# Patient Record
Sex: Male | Born: 1950
Health system: Southern US, Community
[De-identification: ages and names within clinical notes are randomized; demographics above are authoritative.]

## PROBLEM LIST (undated history)

## (undated) DIAGNOSIS — H9190 Unspecified hearing loss, unspecified ear: Secondary | ICD-10-CM

## (undated) DIAGNOSIS — E7800 Pure hypercholesterolemia, unspecified: Secondary | ICD-10-CM

## (undated) DIAGNOSIS — M549 Dorsalgia, unspecified: Secondary | ICD-10-CM

## (undated) DIAGNOSIS — J309 Allergic rhinitis, unspecified: Secondary | ICD-10-CM

## (undated) HISTORY — DX: Pure hypercholesterolemia, unspecified: E78.00

## (undated) HISTORY — DX: Unspecified hearing loss, unspecified ear: H91.90

## (undated) HISTORY — DX: Allergic rhinitis, unspecified: J30.9

## (undated) HISTORY — PX: VASECTOMY: SHX75

## (undated) HISTORY — DX: Dorsalgia, unspecified: M54.9

---

## 2016-08-12 DIAGNOSIS — Z01818 Encounter for other preprocedural examination: Secondary | ICD-10-CM | POA: Diagnosis not present

## 2016-08-12 DIAGNOSIS — Z1211 Encounter for screening for malignant neoplasm of colon: Secondary | ICD-10-CM | POA: Diagnosis not present

## 2016-09-15 DIAGNOSIS — E7800 Pure hypercholesterolemia, unspecified: Secondary | ICD-10-CM | POA: Diagnosis not present

## 2016-09-15 DIAGNOSIS — Z79899 Other long term (current) drug therapy: Secondary | ICD-10-CM | POA: Diagnosis not present

## 2016-09-15 DIAGNOSIS — K5730 Diverticulosis of large intestine without perforation or abscess without bleeding: Secondary | ICD-10-CM | POA: Diagnosis not present

## 2016-09-15 DIAGNOSIS — Z1211 Encounter for screening for malignant neoplasm of colon: Secondary | ICD-10-CM | POA: Diagnosis not present

## 2016-09-29 DIAGNOSIS — Z125 Encounter for screening for malignant neoplasm of prostate: Secondary | ICD-10-CM | POA: Diagnosis not present

## 2016-09-29 DIAGNOSIS — Z23 Encounter for immunization: Secondary | ICD-10-CM | POA: Diagnosis not present

## 2016-09-29 DIAGNOSIS — Z79899 Other long term (current) drug therapy: Secondary | ICD-10-CM | POA: Diagnosis not present

## 2016-09-29 DIAGNOSIS — Z0000 Encounter for general adult medical examination without abnormal findings: Secondary | ICD-10-CM | POA: Diagnosis not present

## 2016-09-29 DIAGNOSIS — E785 Hyperlipidemia, unspecified: Secondary | ICD-10-CM | POA: Diagnosis not present

## 2016-09-29 DIAGNOSIS — Z9181 History of falling: Secondary | ICD-10-CM | POA: Diagnosis not present

## 2016-09-29 DIAGNOSIS — Z1389 Encounter for screening for other disorder: Secondary | ICD-10-CM | POA: Diagnosis not present

## 2017-05-06 DIAGNOSIS — H25813 Combined forms of age-related cataract, bilateral: Secondary | ICD-10-CM | POA: Diagnosis not present

## 2017-06-09 DIAGNOSIS — K219 Gastro-esophageal reflux disease without esophagitis: Secondary | ICD-10-CM | POA: Diagnosis not present

## 2017-06-09 DIAGNOSIS — R490 Dysphonia: Secondary | ICD-10-CM | POA: Diagnosis not present

## 2017-10-07 DIAGNOSIS — Z1339 Encounter for screening examination for other mental health and behavioral disorders: Secondary | ICD-10-CM | POA: Diagnosis not present

## 2017-10-07 DIAGNOSIS — Z6824 Body mass index (BMI) 24.0-24.9, adult: Secondary | ICD-10-CM | POA: Diagnosis not present

## 2017-10-07 DIAGNOSIS — E785 Hyperlipidemia, unspecified: Secondary | ICD-10-CM | POA: Diagnosis not present

## 2017-10-07 DIAGNOSIS — Z1331 Encounter for screening for depression: Secondary | ICD-10-CM | POA: Diagnosis not present

## 2017-10-07 DIAGNOSIS — R490 Dysphonia: Secondary | ICD-10-CM | POA: Diagnosis not present

## 2017-10-07 DIAGNOSIS — Z79899 Other long term (current) drug therapy: Secondary | ICD-10-CM | POA: Diagnosis not present

## 2017-10-07 DIAGNOSIS — Z9181 History of falling: Secondary | ICD-10-CM | POA: Diagnosis not present

## 2017-10-19 DIAGNOSIS — R490 Dysphonia: Secondary | ICD-10-CM | POA: Diagnosis not present

## 2017-10-19 DIAGNOSIS — Z6824 Body mass index (BMI) 24.0-24.9, adult: Secondary | ICD-10-CM | POA: Diagnosis not present

## 2017-11-16 DIAGNOSIS — R490 Dysphonia: Secondary | ICD-10-CM | POA: Diagnosis not present

## 2017-11-16 DIAGNOSIS — K219 Gastro-esophageal reflux disease without esophagitis: Secondary | ICD-10-CM | POA: Diagnosis not present

## 2017-11-16 DIAGNOSIS — R0982 Postnasal drip: Secondary | ICD-10-CM | POA: Diagnosis not present

## 2017-12-21 DIAGNOSIS — H25813 Combined forms of age-related cataract, bilateral: Secondary | ICD-10-CM | POA: Diagnosis not present

## 2017-12-30 DIAGNOSIS — H353131 Nonexudative age-related macular degeneration, bilateral, early dry stage: Secondary | ICD-10-CM | POA: Diagnosis not present

## 2018-02-18 DIAGNOSIS — H2511 Age-related nuclear cataract, right eye: Secondary | ICD-10-CM | POA: Diagnosis not present

## 2018-02-18 DIAGNOSIS — H40013 Open angle with borderline findings, low risk, bilateral: Secondary | ICD-10-CM | POA: Diagnosis not present

## 2018-02-18 DIAGNOSIS — Z01818 Encounter for other preprocedural examination: Secondary | ICD-10-CM | POA: Diagnosis not present

## 2018-02-23 DIAGNOSIS — J0100 Acute maxillary sinusitis, unspecified: Secondary | ICD-10-CM | POA: Diagnosis not present

## 2018-03-01 DIAGNOSIS — H2511 Age-related nuclear cataract, right eye: Secondary | ICD-10-CM | POA: Diagnosis not present

## 2018-03-01 DIAGNOSIS — H353131 Nonexudative age-related macular degeneration, bilateral, early dry stage: Secondary | ICD-10-CM | POA: Diagnosis not present

## 2018-03-01 DIAGNOSIS — H259 Unspecified age-related cataract: Secondary | ICD-10-CM | POA: Diagnosis not present

## 2018-03-01 DIAGNOSIS — H40013 Open angle with borderline findings, low risk, bilateral: Secondary | ICD-10-CM | POA: Diagnosis not present

## 2018-03-01 DIAGNOSIS — H52223 Regular astigmatism, bilateral: Secondary | ICD-10-CM | POA: Diagnosis not present

## 2018-03-01 DIAGNOSIS — H25811 Combined forms of age-related cataract, right eye: Secondary | ICD-10-CM | POA: Diagnosis not present

## 2018-04-19 DIAGNOSIS — H259 Unspecified age-related cataract: Secondary | ICD-10-CM | POA: Diagnosis not present

## 2018-04-19 DIAGNOSIS — H2512 Age-related nuclear cataract, left eye: Secondary | ICD-10-CM | POA: Diagnosis not present

## 2018-04-19 DIAGNOSIS — H25812 Combined forms of age-related cataract, left eye: Secondary | ICD-10-CM | POA: Diagnosis not present

## 2018-04-20 DIAGNOSIS — Z961 Presence of intraocular lens: Secondary | ICD-10-CM | POA: Diagnosis not present

## 2018-05-24 DIAGNOSIS — Z961 Presence of intraocular lens: Secondary | ICD-10-CM | POA: Diagnosis not present

## 2018-08-09 DIAGNOSIS — S99911A Unspecified injury of right ankle, initial encounter: Secondary | ICD-10-CM | POA: Diagnosis not present

## 2018-09-06 DIAGNOSIS — E785 Hyperlipidemia, unspecified: Secondary | ICD-10-CM | POA: Diagnosis not present

## 2018-09-06 DIAGNOSIS — Z125 Encounter for screening for malignant neoplasm of prostate: Secondary | ICD-10-CM | POA: Diagnosis not present

## 2018-09-06 DIAGNOSIS — Z79899 Other long term (current) drug therapy: Secondary | ICD-10-CM | POA: Diagnosis not present

## 2018-09-09 DIAGNOSIS — G20 Parkinson's disease: Secondary | ICD-10-CM | POA: Diagnosis not present

## 2018-09-09 DIAGNOSIS — E785 Hyperlipidemia, unspecified: Secondary | ICD-10-CM | POA: Diagnosis not present

## 2018-09-09 DIAGNOSIS — D649 Anemia, unspecified: Secondary | ICD-10-CM | POA: Diagnosis not present

## 2018-09-09 DIAGNOSIS — Z0000 Encounter for general adult medical examination without abnormal findings: Secondary | ICD-10-CM | POA: Diagnosis not present

## 2018-09-09 DIAGNOSIS — Z6823 Body mass index (BMI) 23.0-23.9, adult: Secondary | ICD-10-CM | POA: Diagnosis not present

## 2018-09-09 DIAGNOSIS — G4769 Other sleep related movement disorders: Secondary | ICD-10-CM | POA: Diagnosis not present

## 2018-09-21 ENCOUNTER — Ambulatory Visit (INDEPENDENT_AMBULATORY_CARE_PROVIDER_SITE_OTHER): Payer: PPO | Admitting: Neurology

## 2018-09-21 ENCOUNTER — Encounter: Payer: Self-pay | Admitting: Neurology

## 2018-09-21 VITALS — BP 128/90 | HR 67 | Ht 69.0 in | Wt 159.0 lb

## 2018-09-21 DIAGNOSIS — F515 Nightmare disorder: Secondary | ICD-10-CM | POA: Diagnosis not present

## 2018-09-21 DIAGNOSIS — G20 Parkinson's disease: Secondary | ICD-10-CM | POA: Diagnosis not present

## 2018-09-21 DIAGNOSIS — G4752 REM sleep behavior disorder: Secondary | ICD-10-CM

## 2018-09-21 NOTE — Progress Notes (Signed)
Subjective:    Patient ID: David Krueger is a 67 y.o. male.  HPI     David Foley, MD, PhD David Krueger [address], [address] [address] [place], [place] [address]  Dear Dr. Leonor Krueger,  I saw your patient, David Krueger, upon your kind request in my neurologic clinic today for initial consultation of his parkinsonism. The patient is accompanied by his wife today. As you know, David Krueger is a 67 year old right-handed gentleman with an underlying medical history of allergic rhinitis, hyperlipidemia, and anemia, who has had some recent changes in his gait and posture. He has had an intermittent tremor, he does not notice his motor changes or posture changes himself but his kids have noticed it and wife has noticed it. He is more concerned about his sleep disturbance. His wife reports that for the past 3+ years he has had intermittent vivid dreams and erratic behaviors and dreams including movements, twitching, and even more dramatic movements such as grabbing her. She sometimes gets scared because of the unpredictable behaviors in his sleep. They still sleep in the same bed together. They have been married 43 years. She has noticed increase in snoring and sometimes apneic pauses while he is asleep. He sometimes reports vivid dreams to her including fighting with an animal but typically does not have recollection of his dreams. He has a history of Parkinson's disease in paternal grandmother. His own mother lived to be 56 and had trembling or parkinsonism in her 8s. Of note, there son who is currently 19 was diagnosed with essential tremor when he was a child, maybe as young as 42 years old, no significant progression in the tremor and son has not been on any symptomatic treatment for this. Cognitively, patient is fine. He retired last year, he was a Clinical research associate. He is very active physically and socially. He likes to golf about 3 days out of the week. They have 3 children, son is the  oldest, they have 1 daughter in [place] and one in [place], [place]; she has 2 kids, ages 82 and 3. I reviewed your office note from 09/09/2018, which you kindly included. His Epworth sleepiness score is 8 out of 24 today, fatigue score is 21 out of 63. He is particularly worried about his sleep disturbance. He is married and lives with his wife, he is a nonsmoker, retired, does not utilize alcohol or caffeine on a regular basis.   His Past Medical History Is Significant For: Past Medical History:  Diagnosis Date  . Allergic rhinitis   . Hypercholesterolemia      His Past Surgical History Is Significant For:  His Family History Is Significant For: No family history on file.  His Social History Is Significant For: Social History   Socioeconomic History  . Marital status: Married    Spouse name: Not on file  . Number of children: Not on file  . Years of education: Not on file  . Highest education level: Not on file  Occupational History  . Not on file  Social Needs  . Financial resource strain: Not on file  . Food insecurity:    Worry: Not on file    Inability: Not on file  . Transportation needs:    Medical: Not on file    Non-medical: Not on file  Tobacco Use  . Smoking status: Never Smoker  . Smokeless tobacco: Never Used  Substance and Sexual Activity  . Alcohol use: Never    Frequency: Never  .  Drug use: Not on file  . Sexual activity: Not on file  Lifestyle  . Physical activity:    Days per week: Not on file    Minutes per session: Not on file  . Stress: Not on file  Relationships  . Social connections:    Talks on phone: Not on file    Gets together: Not on file    Attends religious service: Not on file    Active member of club or organization: Not on file    Attends meetings of clubs or organizations: Not on file    Relationship status: Not on file  Other Topics Concern  . Not on file  Social History Narrative  . Not on file    His Allergies Are:  No  Known Allergies:   His Current Medications Are:  Outpatient Encounter Medications as of 09/21/2018  Medication Sig  . Multiple Vitamins-Minerals (ICAPS AREDS 2 PO) Take by mouth.  . Multiple Vitamins-Minerals (MULTIVITAMIN WITH MINERALS) tablet Take 1 tablet by mouth daily.  . niacin 500 MG tablet Take 500 mg by mouth daily.  . Omega-3 Fatty Acids (FISH OIL) 1000 MG CAPS Take by mouth.  Marland Kitchen SIMVASTATIN PO Take by mouth daily.   No facility-administered encounter medications on file as of 09/21/2018.   : Review of Systems:  Out of a complete 14 point review of systems, all are reviewed and negative with the exception of these symptoms as listed below: Review of Systems  Neurological:       Pt presents today to discuss his sleep. Pt and his wife report unwanted behaviors in his sleep. Pt has never had a sleep study but does endorse some snoring. Pt is right handed.  Epworth Sleepiness Scale 0= would never doze 1= slight chance of dozing 2= moderate chance of dozing 3= high chance of dozing  Sitting and reading: 1 Watching TV: 2 Sitting inactive in a public place (ex. Theater or meeting): 0 As a passenger in a car for an hour without a break: 1 Lying down to rest in the afternoon: 3 Sitting and talking to someone: 0 Sitting quietly after lunch (no alcohol): 1 In a car, while stopped in traffic: 0 Total: 8     Objective:  Neurological Exam  Physical Exam Physical Examination:   Vitals:   09/21/18 1302  BP: 128/90  Pulse: 67    General Examination: The patient is a very pleasant 67 y.o. male in no acute distress. He appears well-developed and well-nourished and well groomed.   HEENT: Normocephalic, atraumatic, pupils are equal, round and reactive to light and accommodation. Extraocular tracking is fairly well-preserved, hearing is grossly intact. Face is symmetric, he has mild facial masking. He has a very slight intermittent lower jaw and lip tremor. Speech is perhaps  mildly hypophonic.  Neck shows mild to moderate nuchal rigidity.   Chest: Clear to auscultation without wheezing, rhonchi or crackles noted.  Heart: S1+S2+0, regular and normal without murmurs, rubs or gallops noted.   Abdomen: Soft, non-tender and non-distended with normal bowel sounds appreciated on auscultation.  Extremities: There is no pitting edema in the distal lower extremities bilaterally.  Skin: Warm and dry without trophic changes noted.  Musculoskeletal: exam reveals no obvious joint deformities, tenderness or joint swelling or erythema.   Neurologically:  Mental status: The patient is awake, alert and oriented in all 4 spheres. His immediate and remote memory, attention, language skills and fund of knowledge are appropriate. There is no evidence of aphasia,  agnosia, apraxia or anomia. Speech is clear with normal prosody and enunciation. Thought process is linear. Mood is normal and affect is normal.  Cranial nerves II - XII are as described above under HEENT exam. In addition: shoulder shrug is normal But left shoulder is slightly higher than right. Motor exam: Normal bulk, strength and tone with the exception of mild cogwheeling in the left upper extremity. He has an intermittent very slight resting tremor in the left hand only. He has no other tremor in the limbs.  On 09/21/2018:on Archimedes spiral drawing he has no significant difficulty with his right hand which is his dominant hand, he has mild insecurity but no obvious trembling with the left hand, handwriting is legible, not particularly tremulous, but on the smaller side. He has no significant postural or action tremor, no intention tremor. On fine motor testing he has no significant difficulty on the right, minimal difficulty on the left noted with finger taps and foot taps.  Romberg is negative. Reflexes are 1+ throughout.   Cerebellar testing: No dysmetria or intention tremor on finger to nose testing. There is no  truncal or gait ataxia.  Sensory exam: intact to light touch in the upper and lower extremities.  Gait, station and balance: He standswithout difficulty and posture is mildly stooped for age. He walks with fairly good stride length and pace, but decrease in arm swing on the left. He turns without problems, balance is preserved.  Assessment and Plan:   In summary, David Krueger is a very pleasant 67 y.o.-year old male with an underlying medical history of allergic rhinitis, hyperlipidemia, and anemia, who Presents for evaluation of his dream enactment behavior, changes in his gait and fine motor skills. Symptoms with regards to his sleep disturbance go back 3 or 4 years, motor symptoms go back perhaps 2 years. His history and physical examination are concerning for parkinsonism, with left-sided lateralization, concerning for underlying Parkinson's disease. His history is also in keeping with REM behavior disorder. I had a long chat with the patient and his wife about His symptoms, my findings and the diagnosis of parkinsonism/Parksinson's disease, and RBD, the prognosis and treatment options. We talked about medical treatments and non-pharmacological approaches. We talked about the importance of maintaining a healthy lifestyle in general. I encouraged the patient to eat healthy, exercise daily and keep well hydrated, to keep a scheduled bedtime and wake time routine, to not skip any meals and eat healthy snacks in between meals and to have protein with every meal. In particular, I stressed the importance of regular exercise, and to be proctive about constipation.   As far as further diagnostic testing is concerned, I suggested: DaT scan, explained this scan to them the differences between nuclear medicine scanning and MRI. I would also like to proceed with a sleep study for concern for RBD. As far as medications are concerned, I recommended the following at this time: we talked about potentially utilizing  symptomatic medications for parkinsonism but also for his sleep disturbance.    I would like to see him back in 3-4 months, sooner if needed. I answered all their questions today and the patient and his wife were in agreement.we will keep them posted in the interim as to his test results. Thank you very much for allowing me to participate in the care of this nice patient. If I can be of any further assistance to you please do not hesitate to call me at [phone].  Sincerely,  David Foley, MD, PhD  I spent 60 minutes in total face-to-face time with the patient, more than 50% of which was spent in counseling and coordination of care, reviewing test results, reviewing medication and discussing or reviewing the diagnosis of PD and RBD, the prognosis and treatment options. Pertinent laboratory and imaging test results that were available during this visit with the patient were reviewed by me and considered in my medical decision making (see chart for details).

## 2018-09-21 NOTE — Patient Instructions (Signed)
I think you have signs and symptoms of mild parkinsonism, possibly mild Parkinson's disease.   Please continue with your healthy lifestyle, including walking, and golfing.   I do want to suggest a few things today:  We will do a sleep study for your dream enactment behavior. You may have a condition called REM behavior disorder (RBD). This means, that you have a tendency to act out your dreams in your sleep. The frequency of this problem is highly variable and may range from night to night episodes to going days and weeks without any problems. This condition may result in sleep disruption, and therefore daytime symptoms such as lack of energy, problems focusing and concentrating and daytime sleepiness. It may result in self injury if you flail your arms and legs and even roll out of bed but also injury to your bed partner, if you accidentally grab, or punch or hit your bed partner. Making your sleep environment safe for you and your bed partner is important. We do not actually know what causes this condition exactly. It can be at times linked to taking a certain medication (such as an antidepressant), or an underlying medical condition or stressor, or an underlying neuro-degenerative disease such as Parkinson's disease (PD). We do not understand fully how RBD and Parkinson's disease are related and why RBD may occur at times many years before Parkinson's disease symptoms develop in some cases. Nevertheless, there may be up to 40% correlation between RBD and PD. We will monitor your exam and progress.   Please be really proactive about constipation.   Remember to drink plenty of fluid at least 6 glasses (8 oz each), eat healthy meals and do not skip any meals. Try to eat protein with a every meal and eat a healthy snack such as fruit or nuts in between meals. Try to keep a regular sleep-wake schedule and try to exercise daily, particularly in the form of walking, 20-30 minutes a day, if you can.    Try to  stay active physically and mentally. Engage in social activities in your community and with your family and try to keep up with current events by reading the newspaper or watching the news. Try to do word puzzles and you may like to do puzzles and brain games on the computer such as on [email or web address].   As far as diagnostic testing, I will order: DaT scan: This is a specialized brain scan designed to help with diagnosis of tremor disorders. A radioactive marker gets injected and the uptake is measured in the brain and compared to normal controls and right side is compared to the left, a change in uptake can help with diagnosis of certain tremor disorders. A brain MRI on the other hand is a brain scan that helps look at the brain structure in more detail overall and look for age-related changes, blood vessel related changes and look for stroke and volume loss which we call atrophy.   I would like to see you back in 4 months, sooner if we need to. Please call us with any interim questions, concerns, problems, updates or refill requests.  Our phone number is [phone]. We also have an after hours call service for urgent matters and there is a physician on-call for urgent questions, that cannot wait till the next work day. For any emergencies you know to call 911 or go to the nearest emergency room.

## 2018-10-08 DIAGNOSIS — G20 Parkinson's disease: Secondary | ICD-10-CM | POA: Diagnosis not present

## 2018-10-08 DIAGNOSIS — E785 Hyperlipidemia, unspecified: Secondary | ICD-10-CM | POA: Diagnosis not present

## 2018-10-10 DIAGNOSIS — D508 Other iron deficiency anemias: Secondary | ICD-10-CM | POA: Diagnosis not present

## 2018-10-18 ENCOUNTER — Ambulatory Visit (INDEPENDENT_AMBULATORY_CARE_PROVIDER_SITE_OTHER): Payer: PPO | Admitting: Neurology

## 2018-10-18 DIAGNOSIS — G4720 Circadian rhythm sleep disorder, unspecified type: Secondary | ICD-10-CM

## 2018-10-18 DIAGNOSIS — G20C Parkinsonism, unspecified: Secondary | ICD-10-CM

## 2018-10-18 DIAGNOSIS — G4731 Primary central sleep apnea: Secondary | ICD-10-CM | POA: Diagnosis not present

## 2018-10-18 DIAGNOSIS — G4752 REM sleep behavior disorder: Secondary | ICD-10-CM

## 2018-10-18 DIAGNOSIS — G20 Parkinson's disease: Secondary | ICD-10-CM

## 2018-10-18 DIAGNOSIS — F515 Nightmare disorder: Secondary | ICD-10-CM

## 2018-10-21 NOTE — Procedures (Signed)
PATIENT'S NAME:  David Krueger, Advik DOB:      1950/12/18      MR#:    [id number]     DATE OF RECORDING: 10/18/2018 REFERRING M.D.:  Juleen ChinaLynley Holt, MD Study Performed:   Baseline Polysomnogram HISTORY: 67 year old man with a history of allergic rhinitis, hyperlipidemia, and anemia, who has had dream enactment behaviors, and recent changes in his gait and posture. The patient endorsed the Epworth Sleepiness Scale at 8 points. The patient's weight 159 pounds with a height of 69 (inches), resulting in a BMI of 23.5 kg/m2. The patient's neck circumference measured 16 inches.  CURRENT MEDICATIONS: Simvastatin   PROCEDURE:  This is a multichannel digital polysomnogram utilizing the Somnostar 11.2 system.  Electrodes and sensors were applied and monitored per AASM Specifications.   EEG, EOG, Chin and Limb EMG, were sampled at 200 Hz.  ECG, Snore and Nasal Pressure, Thermal Airflow, Respiratory Effort, CPAP Flow and Pressure, Oximetry was sampled at 50 Hz. Digital video and audio were recorded.      BASELINE STUDY  Lights Out was at 21:57 and Lights On at 04:59.  Total recording time (TRT) was 423 minutes, with a total sleep time (TST) of 300 minutes.   The patient's sleep latency was 46 minutes, which is delayed. REM latency was 82 minutes. The sleep efficiency was 70.9%, which is reduced.     SLEEP ARCHITECTURE: WASO (Wake after sleep onset) was 94 minutes, with mild to moderate sleep fragmentation noted. There were 17.5 minutes in Stage N1, 111 minutes Stage N2, 137.5 minutes Stage N3 and 34 minutes in Stage REM.  The percentage of Stage N1 was 5.8%, Stage N2 was 37.%, Stage N3 was 45.8% and Stage R (REM sleep) was 11.3%, which is reduced. The arousals were noted as: 18 were spontaneous, 0 were associated with PLMs, 2 were associated with respiratory events.  RESPIRATORY ANALYSIS:  There were a total of 132 respiratory events:  0 obstructive apneas, 132 central apneas and 0 mixed apneas with a total of 132  apneas and an apnea index (AI) of 26.4 /hour. There were 0 hypopneas with a hypopnea index of 0 /hour. The patient also had 0 respiratory event related arousals (RERAs).      The total APNEA/HYPOPNEA INDEX (AHI) was 26.4 /hour and the total RESPIRATORY DISTURBANCE INDEX was 0. 26.4 /hour.  2 events occurred in REM sleep and 130 events in NREM. The REM AHI was 0. 3.5 /hour, versus a non-REM AHI of 29.3. The patient spent 298 minutes of total sleep time in the supine position and 2 minutes in non-supine.. The supine AHI was 26.2 versus a non-supine AHI of 60.0.  OXYGEN SATURATION & C02:  The Wake baseline 02 saturation was 94%, with the lowest being 86%. Time spent below 89% saturation equaled 1 minutes.  PERIODIC LIMB MOVEMENTS: The patient had a total of 0 Periodic Limb Movements.  The Periodic Limb Movement (PLM) index was 0 and the PLM Arousal index was 0/hour. Audio and video analysis did not show any abnormal or unusual movements, behaviors, phonations or vocalizations. No increase in EMG activity or tone were noted during REM sleep.   The patient took 1 bathroom break. No significant snoring was noted. The EKG was in keeping with normal sinus rhythm (NSR).  Post-study, the patient indicated that sleep was worse than usual.   IMPRESSION:  1. Primary Central Sleep Apnea  2. Dysfunctions associated with sleep stages or arousal from sleep  RECOMMENDATIONS:  1. This study shows  no obstructive sleep apnea. The patient has moderate (by number of events), primary central sleep apnea with milder desaturations. Treatment may be considered in the form of BiPAP, BiPAP ST or ASV, if needed. I would recommend a full night titration study. 2. The patient gives a fairly telltale clinical history of RBD; this study did not show obvious EMG and behavioral findings in keeping with RBD. Of note, treatment of sleep disordered breathing may improve the parasomnia.  3. This study shows sleep fragmentation and  abnormal sleep stage percentages; these are nonspecific findings and per se do not signify an intrinsic sleep disorder or a cause for the patient's sleep-related symptoms. Causes include (but are not limited to) the first night effect of the sleep study, circadian rhythm disturbances, medication effect or an underlying mood disorder or medical problem.  4. The patient should be cautioned not to drive, work at heights, or operate dangerous or heavy equipment when tired or sleepy. Review and reiteration of good sleep hygiene measures should be pursued with any patient. 5. The patient will be seen in follow-up in the sleep clinic at [facility] for discussion of the test results, symptom and treatment compliance review, further management strategies, etc. The referring provider will be notified of the test results.  I certify that I have reviewed the entire raw data recording prior to the issuance of this report in accordance with the Standards of Accreditation of the American Academy of Sleep Medicine (AASM)   [name], MD, PhD Diplomat, American Board of Neurology and Sleep Medicine (Neurology and Sleep Medicine)

## 2018-10-21 NOTE — Progress Notes (Signed)
Patient referred by Dr. Leonor LivHolt, seen by me on 09/21/18, diagnostic PSG on 10/18/18.   Please call and notify the patient that the recent sleep study showed no obstructive sleep apnea. The patient has moderate (by number of events), primary central sleep apnea with milder desaturations. We can try treatment in the form of BiPAP, BiPAP ST or ASV, if needed. I would recommend a full night titration study. The patient gives a fairly telltale clinical history of RBD; the sleep study did not show obvious EMG and behavioral findings in keeping with RBD. Of note, treatment of his sleep disordered breathing may improve the parasomnia. Please explain to patient. I have placed an order in the chart. DaT scan pending for Jan. Thanks.  Huston FoleySaima Tecumseh Yeagley, MD, PhD [facility]

## 2018-10-21 NOTE — Addendum Note (Signed)
Addended by: Huston FoleyATHAR, Carmina Walle on: 10/21/2018 01:10 PM   Modules accepted: Orders

## 2018-10-24 ENCOUNTER — Telehealth: Payer: Self-pay

## 2018-10-24 NOTE — Telephone Encounter (Signed)
I called pt. I advised pt that Dr. Frances FurbishAthar reviewed their sleep study results and found that did not show osa but did show moderate csa with desaturations and recommends that pt be treated with a bipap. Dr. Frances FurbishAthar recommends that pt return for a repeat sleep study in order to properly titrate the bipap and ensure a good mask fit. Pt is agreeable to returning for a titration study. I advised pt that our sleep lab will file with pt's insurance and call pt to schedule the sleep study when we hear back from the pt's insurance regarding coverage of this sleep study. I advised pt that the sleep study did not show obvious EMG or behavioral findings in keeping with RBD. Pt verbalized understanding of results.  Pt is wondering if his DAT scan was approved by insurance. I will ask Annabelle HarmanDana to call pt to discuss this.

## 2018-10-24 NOTE — Telephone Encounter (Signed)
I called pt to discuss. No answer, left a message asking him to call me back. 

## 2018-10-24 NOTE — Telephone Encounter (Signed)
-----   Message from Huston FoleySaima Athar, MD sent at 10/21/2018  1:10 PM EST ----- Patient referred by Dr. Leonor LivHolt, seen by me on 09/21/18, diagnostic PSG on 10/18/18.   Please call and notify the patient that the recent sleep study showed no obstructive sleep apnea. The patient has moderate (by number of events), primary central sleep apnea with milder desaturations. We can try treatment in the form of BiPAP, BiPAP ST or ASV, if needed. I would recommend a full night titration study. The patient gives a fairly telltale clinical history of RBD; the sleep study did not show obvious EMG and behavioral findings in keeping with RBD. Of note, treatment of his sleep disordered breathing may improve the parasomnia. Please explain to patient. I have placed an order in the chart. DaT scan pending for Jan. Thanks.  Huston FoleySaima Athar, MD, PhD [facility])

## 2018-10-24 NOTE — Telephone Encounter (Signed)
I called pt to discuss his sleep study results. No answer, left a message asking him to call me back. 

## 2018-10-24 NOTE — Telephone Encounter (Signed)
Called and spoke to patient and relayed he was approved and DAT Scan is scheduled  12/01/2018 . Patient was fine with this.  Kristen , Patient would like for you to call him back please he has a few more questions about sleep study. Thanks Annabelle Harmanana.

## 2018-10-25 NOTE — Telephone Encounter (Addendum)
Pt returned my call. He is wondering if his sleep study will be covered. I advised him that our sleep lab will be in touch with him regarding the sleep study coverage. Pt verbalized understanding.

## 2018-11-17 ENCOUNTER — Ambulatory Visit (INDEPENDENT_AMBULATORY_CARE_PROVIDER_SITE_OTHER): Payer: PPO | Admitting: Neurology

## 2018-11-17 DIAGNOSIS — G20 Parkinson's disease: Secondary | ICD-10-CM

## 2018-11-17 DIAGNOSIS — F515 Nightmare disorder: Secondary | ICD-10-CM

## 2018-11-17 DIAGNOSIS — G4731 Primary central sleep apnea: Secondary | ICD-10-CM

## 2018-11-17 DIAGNOSIS — G4752 REM sleep behavior disorder: Secondary | ICD-10-CM

## 2018-11-17 DIAGNOSIS — G4720 Circadian rhythm sleep disorder, unspecified type: Secondary | ICD-10-CM

## 2018-11-23 ENCOUNTER — Telehealth: Payer: Self-pay

## 2018-11-23 NOTE — Telephone Encounter (Signed)
I called pt. I advised pt that Dr. Frances Furbish reviewed their sleep study results and found that pt did well with a bipap. Pt had one episode of moving and mumbling in his REM sleep. Dr. Frances Furbish recommends that pt start a bipap at home. I reviewed PAP compliance expectations with the pt. Pt is agreeable to starting a BiPAP. I advised pt that an order will be sent to a DME, [facility], and [facility] will call the pt within about one week after they file with the pt's insurance. [facility] will show the pt how to use the machine, fit for masks, and troubleshoot the BiPAP if needed. A follow up appt was made for insurance purposes with Dr. Frances Furbish on 02/21/19 at 9:30am. Pt's 01/23/19 appt was cancelled because it will be too soon per insurance requirements. Pt verbalized understanding to arrive 15 minutes early and bring their BiPAP. A letter with all of this information in it will be mailed to the pt as a reminder. I verified with the pt that the address we have on file is correct. Pt verbalized understanding of results. Pt had no questions at this time but was encouraged to call back if questions arise. I have sent the order to [facility] and have received confirmation that they have received the order.  Pt is wondering if his DAT scan has been approved by his insurance. Will ask Annabelle Harman to review and give pt a call regarding this.

## 2018-11-23 NOTE — Telephone Encounter (Signed)
Patient is scheduled for DAT scan 12/01/2018 I will call and make him aware again . Thanks Annabelle Harman.

## 2018-11-23 NOTE — Progress Notes (Signed)
Patient referred by Dr. Leonor LivHolt, seen by me on 09/21/18, diagnostic PSG on 10/18/18. Patient had a BiPAP titration study on 11/17/18.  Please call and inform patient that I have entered an order for treatment with positive airway pressure (PAP) treatment for his central sleep apnea. He did well with BiPAP of 12/7 cm with a back up rate of 14. He had one brief episode of movements and mumbling during REM sleep, this is in keeping with his Hx of dream enactment behavior. I will see the patient back in follow-up in about 10 weeks. Please also explain to the patient that I will be looking out for compliance data, which can be downloaded from the machine (stored on an SD card, that is inserted in the machine) or via remote access through a modem, that is built into the machine. At the time of the followup appointment we will discuss sleep study results and how it is going with PAP treatment at home. Please advise patient to bring His machine at the time of the first FU visit, even though this is cumbersome. Bringing the machine for every visit after that will likely not be needed, but often helps for the first visit to troubleshoot if needed. Please re-enforce the importance of compliance with treatment and the need for us to monitor compliance data - often an insurance requirement and actually good feedback for the patient as far as how they are doing.  Also remind patient, that any interim PAP machine or mask issues should be first addressed with the DME company, as they can often help better with technical and mask fit issues. Please ask if patient has a preference regarding DME company.  Please also make sure, the patient has a follow-up appointment with me in about 10 weeks from the setup date, thanks. May see one of our nurse practitioners if needed for proper timing of the FU appointment.  Please fax or rout report to the referring provider. Thanks,   Huston FoleySaima Valentino Saavedra, MD, PhD [facility])

## 2018-11-23 NOTE — Procedures (Signed)
S PATIENT'S NAME:  David Krueger, David Krueger DOB:      [birth year]      MR#:    [id number]     DATE OF RECORDING: 11/17/2018 REFERRING M.D.:  Juleen China, MD Study Performed:   Bi-level Titration    HISTORY: 68 year old man with a history of allergic rhinitis, hyperlipidemia, and anemia, who has a history of dream enactment behavior and central sleep apnea. He presents for a full night BiPAP titration study. He had a baseline study on 10/18/18.  CURRENT MEDICATIONS: Simvastatin, Multivitamin/Mineral, Omega 3 fish oil   PROCEDURE:  This is a multichannel digital polysomnogram utilizing the SomnoStar 11.2 system.  Electrodes and sensors were applied and monitored per AASM Specifications.   EEG, EOG, Chin and Limb EMG, were sampled at 200 Hz.  ECG, Snore and Nasal Pressure, Thermal Airflow, Respiratory Effort, CPAP Flow and Pressure, Oximetry was sampled at 50 Hz. Digital video and audio were recorded.      He was fitted with a medium F30 FFM. BiPAP was initiated at 8/4 cmH20 with heated humidity per AASM standards and pressure was advanced to 10/6 cmH20 but he had ongoing central events. He was switched to BiPAP ST with a rate of 12 at epoch 373, 01:26. He was further titrated to a pressure of 12/7 cm and a back up rate of 14. On the final setting, his AHI was 0/hour with non-supine REM sleep achieved and O2 nadir of 91%.   Lights Out was at 00:13 and Lights On at 06:44. Total recording time (TRT) was 391.5 minutes, with a total sleep time (TST) of 257.5 minutes. The patient's sleep latency was 64.5 minutes. REM latency was 70 minutes.  The sleep efficiency was 65.8 %.    SLEEP ARCHITECTURE: WASO (Wake after sleep onset) was 119.5 minutes with moderate sleep fragmentation noted. There were 42 minutes in Stage N1, 113 minutes Stage N2, 57.5 minutes Stage N3 and 45 minutes in Stage REM.  The percentage of Stage N1 was 16.3%, which is increased, Stage N2 was 43.9%, Stage N3 was 22.3% and Stage R (REM sleep) was 17.5%,  which is near-normal. The arousals were noted as: 16 were spontaneous, 0 were associated with PLMs, 0 were associated with respiratory events.  RESPIRATORY ANALYSIS:  There was a total of 42 respiratory events: 0 obstructive apneas, 36 central apneas and 3 mixed apneas with a total of 39 apneas and an apnea index (AI) of 9.1 /hour. There were 3 hypopneas with a hypopnea index of .7/hour. The patient also had 0 respiratory event related arousals (RERAs).      The total APNEA/HYPOPNEA INDEX  (AHI) was 9.8 /hour and the total RESPIRATORY DISTURBANCE INDEX was 9.8 /hour  7 events occurred in REM sleep and 35 events in NREM. The REM AHI was 9.3 /hour versus a non-REM AHI of 9.9 /hour.  The patient spent 64 minutes of total sleep time in the supine position and 194 minutes in non-supine. The supine AHI was 27.2, versus a non-supine AHI of 4.0.  OXYGEN SATURATION & C02:  The baseline 02 saturation was 94%, with the lowest being 88%. Time spent below 89% saturation equaled 1 minutes.  PERIODIC LIMB MOVEMENTS:  The patient had a total of 0 Periodic Limb Movements. The Periodic Limb Movement (PLM) index was 0 and the PLM Arousal index was 0 /hour.  Audio and video analysis did not show any abnormal or unusual movements, behaviors, phonations or vocalizations. One brief episode (epoch 349) of REM sleep related vocalizations  and increased muscle tone on EMG was noted as well as intermittent subtle increase in EMG tone during REM. No major limb or body movement, no intelligible words, no climbing or (near) falls. The patient took no bathroom breaks. No significant snoring was noted. The EKG was in keeping with normal sinus rhythm (NSR).  Post-study, the patient indicated that sleep was worse than usual.   IMPRESSION:   1. Primary Central Sleep Apnea  2. Dysfunctions associated with sleep stages or arousal from sleep 3. REM behavior disorder   RECOMMENDATIONS:   1. This study shows resolution of the  patient's primary central sleep apnea with BiPAP ST, but not with standard BiPAP. I will, therefore, start the patient on home BiPAP ST of 12/7 cm with a back up rate of 14/min, via medium F30 FFM with heated humidity. The patient should be reminded to be fully compliant with PAP therapy to improve sleep related symptoms and decrease long term cardiovascular risks. The patient should be reminded, that it may take up to 3 months to get fully used to using PAP with all planned sleep. The earlier full compliance is achieved, the better long term compliance tends to be. Please note that untreated sleep apnea may carry additional perioperative morbidity.  2. One brief episode (epoch 349) of REM sleep related vocalizations and increased muscle tone on EMG was noted as well as intermittent subtle increase in EMG tone during REM. No major limb or body movement, no intelligible words, no climbing or (near) falls. This, in conjunction with his history of dream enactment behavior are supportive of a diagnosis of REM behavior disorder (RBD). This condition may result in sleep disruption, and therefore daytime symptoms such as lack of energy, problems focusing and concentrating and daytime sleepiness. It may result in self injury but also injury to the bed partner. Making the sleep environment safe for the patient and bed partner is important. The exact cause of RBD is not known. It is at times linked to other neurological conditions, including neuro-degenerative diseases such as Parkinson's disease (PD). It is not fully understood how RBD and Parkinson's disease are related and why RBD may occur at times many years before Parkinson's disease motor symptoms develop in some cases. Having RBD certainly does not mean the patient will go on to develop Parkinson's disease. Nevertheless, there may be up to 40% correlation between RBD and PD. The patient gives a fairly telltale clinical history of RBD.  3. This study shows sleep  fragmentation and abnormal sleep stage percentages; these are nonspecific findings and per se do not signify an intrinsic sleep disorder or a cause for the patient's sleep-related symptoms. Causes include (but are not limited to) the first night effect of the sleep study, circadian rhythm disturbances, medication effect or an underlying mood disorder or medical problem.  4. The patient should be cautioned not to drive, work at heights, or operate dangerous or heavy equipment when tired or sleepy. Review and reiteration of good sleep hygiene measures should be pursued with any patient. 5. The [facility] for discussion of the test results, symptom and treatment compliance review, further management strategies, etc. The referring provider will be notified of the test results.   I certify that I have reviewed the entire raw data recording prior to the issuance of this report in accordance with the Standards of Accreditation of the American Academy of Sleep Medicine (AASM)     Ladell HeadsSaima  Frances Furbish, MD, PhD Diplomat, American Board of Neurology and Sleep Medicine (Neurology and Sleep Medicine)

## 2018-11-23 NOTE — Addendum Note (Signed)
Addended by: [name] on: 11/23/2018 08:02 AM   Modules accepted: Orders

## 2018-11-23 NOTE — Telephone Encounter (Signed)
-----   Message from Huston FoleySaima Athar, MD sent at 11/23/2018  8:02 AM EST ----- Patient referred by Dr. Leonor LivHolt, seen by me on 09/21/18, diagnostic PSG on 10/18/18. Patient had a BiPAP titration study on 11/17/18.  Please call and inform patient that I have entered an order for treatment with positive airway pressure (PAP) treatment for his central sleep apnea. He did well with BiPAP of 12/7 cm with a back up rate of 14. He had one brief episode of movements and mumbling during REM sleep, this is in keeping with his Hx of dream enactment behavior. I will see the patient back in follow-up in about 10 weeks. Please also explain to the patient that I will be looking out for compliance data, which can be downloaded from the machine (stored on an SD card, that is inserted in the machine) or via remote access through a modem, that is built into the machine. At the time of the followup appointment we will discuss sleep study results and how it is going with PAP treatment at home. Please advise patient to bring His machine at the time of the first FU visit, even though this is cumbersome. Bringing the machine for every visit after that will likely not be needed, but often helps for the first visit to troubleshoot if needed. Please re-enforce the importance of compliance with treatment and the need for us to monitor compliance data - often an insurance requirement and actually good feedback for the patient as far as how they are doing.  Also remind patient, that any interim PAP machine or mask issues should be first addressed with the DME company, as they can often help better with technical and mask fit issues. Please ask if patient has a preference regarding DME company.  Please also make sure, the patient has a follow-up appointment with me in about 10 weeks from the setup date, thanks. May see one of our nurse practitioners if needed for proper timing of the FU appointment.  Please fax or rout report to the referring provider.  Thanks,   Huston FoleySaima Athar, MD, PhD [facility])

## 2018-11-23 NOTE — Telephone Encounter (Signed)
I have talked to Patient and [facility] Will also call patient before apt to go over details and what patient need to as well . DAT Scan.

## 2018-12-01 ENCOUNTER — Encounter (HOSPITAL_COMMUNITY)
Admission: RE | Admit: 2018-12-01 | Discharge: 2018-12-01 | Disposition: A | Discharge status: Home / Self Care | Payer: PPO | Source: Ambulatory Visit | Attending: Neurology | Admitting: Neurology

## 2018-12-01 ENCOUNTER — Telehealth: Payer: Self-pay

## 2018-12-01 DIAGNOSIS — R251 Tremor, unspecified: Secondary | ICD-10-CM | POA: Diagnosis not present

## 2018-12-01 DIAGNOSIS — F515 Nightmare disorder: Secondary | ICD-10-CM | POA: Diagnosis not present

## 2018-12-01 DIAGNOSIS — G4752 REM sleep behavior disorder: Secondary | ICD-10-CM | POA: Insufficient documentation

## 2018-12-01 DIAGNOSIS — G20 Parkinson's disease: Secondary | ICD-10-CM | POA: Diagnosis not present

## 2018-12-01 MED ORDER — IODINE STRONG (LUGOLS) 5 % PO SOLN
0.8000 mL | Freq: Once | ORAL | Status: AC
  Administered 2018-12-01: 0.8 mL via ORAL
  Filled 2018-12-01: qty 0.8

## 2018-12-01 MED ORDER — IOFLUPANE I 123 185 MBQ/2.5ML IV SOLN
4.8000 | Freq: Once | INTRAVENOUS | Status: AC
  Administered 2018-12-01: 4.8 via INTRAVENOUS

## 2018-12-01 NOTE — Progress Notes (Signed)
Please call patient or wife about the recent DaT scan: His scan indicated abnormal (as in lower) uptake as compared to normal uptake pattern indicating a parkinsonian disorder, which includes Parkinson's disease. We can discuss treatment options for his parkinsonism and dream enactment during a FU appt. He has an appointment scheduled for his BiPAP follow-up we can potentially make an interim appointment to talk about medication options.  [name], MD, PhD [facility])

## 2018-12-01 NOTE — Telephone Encounter (Signed)
-----   Message from [name], MD sent at 12/01/2018  3:49 PM EST ----- Please call patient or wife about the recent DaT scan: His scan indicated abnormal (as in lower) uptake as compared to normal uptake pattern indicating a parkinsonian disorder, which includes Parkinson's disease. We can discuss treatment options for his parkinsonism and dream enactment during a FU appt. He has an appointment scheduled for his BiPAP follow-up we can potentially make an interim appointment to talk about medication options.   [name], MD, PhD [facility])

## 2018-12-01 NOTE — Telephone Encounter (Signed)
I called pt and advised him of his DAT scan results. Pt starts his bipap tomorrow and decided he wants to start his bipap and then call me in a few weeks if he decides he wants to come in sooner than April to discuss medications for PD. Otherwise, he will follow up in April of 2020 as scheduled. Pt verbalized understanding of results. Pt had no questions at this time but was encouraged to call back if questions arise.

## 2018-12-01 NOTE — Telephone Encounter (Signed)
Pt called back in and would like a sooner appt. I advised him that I will put him on my waitlist. Pt is very appreciative and I hope to get him in next week.

## 2018-12-02 DIAGNOSIS — G4731 Primary central sleep apnea: Secondary | ICD-10-CM | POA: Diagnosis not present

## 2018-12-02 NOTE — Telephone Encounter (Signed)
I called pt to offer him an appt with Dr. Frances Furbish on 12/06/2018 at 3:00pm. No answer, left a message asking him to call me back. If pt calls back, please offer him this appt.

## 2018-12-05 NOTE — Telephone Encounter (Signed)
Patient returning your call. Offered him appointment with Dr Frances Furbish for tomorrow 12/06/2018 at 3 pm. He will come for that appointment & arrive at 2:30pm.

## 2018-12-05 NOTE — Telephone Encounter (Signed)
Pt has been scheduled for 12/06/18 at 3:00pm.

## 2018-12-06 ENCOUNTER — Ambulatory Visit: Payer: PPO | Admitting: Neurology

## 2018-12-06 ENCOUNTER — Encounter: Payer: Self-pay | Admitting: Neurology

## 2018-12-06 VITALS — BP 114/79 | HR 76 | Ht 68.0 in | Wt 162.0 lb

## 2018-12-06 DIAGNOSIS — G20 Parkinson's disease: Secondary | ICD-10-CM

## 2018-12-06 DIAGNOSIS — G4752 REM sleep behavior disorder: Secondary | ICD-10-CM

## 2018-12-06 DIAGNOSIS — G4731 Primary central sleep apnea: Secondary | ICD-10-CM

## 2018-12-06 MED ORDER — ROPINIROLE HCL ER 2 MG PO TB24: ORAL_TABLET | ORAL | 5 refills | Status: DC

## 2018-12-06 NOTE — Patient Instructions (Addendum)
High dose of CoQ10 seems to have shown that Parkinson's may slow down. You would have to take about 1200 mg daily, such as 400 mg 3 times a day. You can consider starting this.   I will make a referral to neuro rehab.   As discussed, we will start you on Requip (generic name: ropinirole) 2 mg: Take one pill once daily (morning or evening) for one week, then one 2 pills daily (morning) ongoing. Common side effects reported are: Sedation, sleepiness, nausea, vomiting, and rare side effects are confusion, hallucinations, swelling in legs, and abnormal behaviors, including impulse control problems, which can manifest as excessive eating, obsessions with food or gambling, or hypersexuality.

## 2018-12-06 NOTE — Progress Notes (Signed)
Subjective:    Patient ID: David Krueger is a 68 y.o. male.  HPI     Interim history:   David Krueger is a 68 year old right-handed gentleman with an underlying medical history of allergic rhinitis, hyperlipidemia, and anemia, who presents for follow-up consultation of his parkinsonism and sleep disorder. The patient is accompanied by his wife today. I first met him on 09/21/2001 at the request of his primary care physician, at which time David Krueger reported changes in his gait and posture. David Krueger also reported a 3+ year history of vivid dreams and dream enactment behavior. His exam showed evidence of parkinsonism with left-sided lateralization. I suggested further workup for parkinsonism including a DaT scan, and sleep study testing.   David Krueger had a nuclear medicine DaT scan on 12/01/2018 and I reviewed the results: IMPRESSION: Decreased radiotracer activity (dopamine transporter populations) within the posterior LEFT and RIGHT striata (putamen) is a pattern suggestive of Parkinson's syndrome pathology.   We called him with his test results.   His baseline sleep study in December 2019 showed moderate primary central sleep apnea. David Krueger was advised to return for a BiPAP titration study, which David Krueger had in January 2020. During the second sleep study also had some REM related increase in muscle tone and vocalization, in keeping with his history of REM behavior disorder.  Today, 12/06/2018: David Krueger reports no recent falls. David Krueger sleeps in a separate bedroom from his wife now, for the past 10 days or so. David Krueger still reports some vivid dreams but is not sure if David Krueger has had any instances of acting out his dreams. David Krueger just recently started his BiPAP therapy a few days ago. David Krueger is still adapting to treatment. David Krueger denies any new symptoms, his wife reports that perhaps his tremor is a little bit more noticeable and more consistent. David Krueger has had more trouble with his voice, it is at times quite weak and hoarse even. David Krueger had seen ENT in the past  for hoarseness. Was advised to pursue speech therapy at the time but it fell through for some reason.   Previously:  09/21/2018: (David Krueger) has had some recent changes in his gait and posture. David Krueger has had an intermittent tremor, David Krueger does not notice his motor changes or posture changes himself but his kids have noticed it and wife has noticed it. David Krueger is more concerned about his sleep disturbance. His wife reports that for the past 3+ years David Krueger has had intermittent vivid dreams and erratic behaviors and dreams including movements, twitching, and even more dramatic movements such as grabbing her. She sometimes gets scared because of the unpredictable behaviors in his sleep. They still sleep in the same bed together. They have been married 102 years. She has noticed increase in snoring and sometimes apneic pauses while David Krueger is asleep. David Krueger sometimes reports vivid dreams to her including fighting with an animal but typically does not have recollection of his dreams. David Krueger has a history of Parkinson's disease in paternal grandmother. His own mother lived to be 41 and had trembling or parkinsonism in her 86s. Of note, their son who is currently 75 was diagnosed with essential tremor when David Krueger was a child, maybe as young as 58 years old, no significant progression in the tremor and son has not been on any symptomatic treatment for this. Cognitively, patient is fine. 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 David Krueger is very active physically and socially. David Krueger likes to golf about 3 days out of the week. They have 3 children,  son is the oldest, they have 1 daughter in [place] and one in [place], [place]; she has 2 kids, ages 53 and 53. I reviewed your office note from 09/09/2018, which you kindly included. His Epworth sleepiness score is 8 out of 24 today, fatigue score is 21 out of 63. David Krueger is particularly worried about his sleep disturbance. David Krueger is married and lives with his wife, David Krueger is a nonsmoker, retired, does not utilize alcohol or caffeine on a  regular basis.     His Past Medical History Is Significant For: Past Medical History:  Diagnosis Date  . Allergic rhinitis   . Hypercholesterolemia     His Past Surgical History Is Significant For:  His Family History Is Significant For: No family history on file.  His Social History Is Significant For: Social History   Socioeconomic History  . Marital status: Married    Spouse name: Not on file  . Number of children: Not on file  . Years of education: Not on file  . Highest education level: Not on file  Occupational History  . Not on file  Social Needs  . Financial resource strain: Not on file  . Food insecurity:    Worry: Not on file    Inability: Not on file  . Transportation needs:    Medical: Not on file    Non-medical: Not on file  Tobacco Use  . Smoking status: Never Smoker  . Smokeless tobacco: Never Used  Substance and Sexual Activity  . Alcohol use: Never    Frequency: Never  . Drug use: Not on file  . Sexual activity: Not on file  Lifestyle  . Physical activity:    Days per week: Not on file    Minutes per session: Not on file  . Stress: Not on file  Relationships  . Social connections:    Talks on phone: Not on file    Gets together: Not on file    Attends religious service: Not on file    Active member of club or organization: Not on file    Attends meetings of clubs or organizations: Not on file    Relationship status: Not on file  Other Topics Concern  . Not on file  Social History Narrative  . Not on file    His Allergies Are:  No Known Allergies:   His Current Medications Are:  Outpatient Encounter Medications as of 12/06/2018  Medication Sig  . Multiple Vitamins-Minerals (ICAPS AREDS 2 PO) Take by mouth.  . Multiple Vitamins-Minerals (MULTIVITAMIN WITH MINERALS) tablet Take 1 tablet by mouth daily.  . niacin 500 MG tablet Take 500 mg by mouth daily.  . Omega-3 Fatty Acids (FISH OIL) 1000 MG CAPS Take by mouth.  . rosuvastatin  (CRESTOR) 10 MG tablet Take 10 mg by mouth at bedtime.  . [DISCONTINUED] SIMVASTATIN PO Take by mouth daily.   No facility-administered encounter medications on file as of 12/06/2018.   :  Review of Systems:  Out of a complete 14 point review of systems, all are reviewed and negative with the exception of these symptoms as listed below: Review of Systems  Neurological:       Pt presents today to discuss his DAT scan results.    Objective:  Neurological Exam  Physical Exam Physical Examination:   Vitals:   12/06/18 1426  BP: 114/79  Pulse: 76    General Examination: The patient is a very pleasant 68 y.o. male in no acute distress. David Krueger appears well-developed  and well-nourished and well groomed.   HEENT: Normocephalic, atraumatic, pupils are equal, round and reactive to light and accommodation. Extraocular tracking is fairly well-preserved, hearing is grossly intact. Face is symmetric, David Krueger has mild facial masking. David Krueger has a very slight intermittent lower jaw and lip tremor. Speech is hypophonic. Neck shows mild to moderate nuchal rigidity.   Chest: Clear to auscultation without wheezing, rhonchi or crackles noted.  Heart: S1+S2+0, regular and normal without murmurs, rubs or gallops noted.   Abdomen: Soft, non-tender and non-distended with normal bowel sounds appreciated on auscultation.  Extremities: There is no pitting edema in the distal lower extremities bilaterally.  Skin: Warm and dry without trophic changes noted.  Musculoskeletal: exam reveals no obvious joint deformities, tenderness or joint swelling or erythema.   Neurologically:  Mental status: The patient is awake, alert and oriented in all 4 spheres. His immediate and remote memory, attention, language skills and fund of knowledge are appropriate. There is no evidence of aphasia, agnosia, apraxia or anomia. Speech is clear with normal prosody and enunciation. Thought process is linear. Mood is normal and affect  is normal.  Cranial nerves II - XII are as described above under HEENT exam. In addition: shoulder shrug is normal, but left shoulder is slightly higher than right, stable.  Motor exam: Normal bulk, strength and tone with the exception of mild cogwheeling in the left upper extremity, slight cogwheeling in the right upper extremity also. David Krueger has an intermittent mild resting tremor in the left hand only, David Krueger has no other resting tremor.   (On 09/21/2018:on Archimedes spiral drawing David Krueger has no significant difficulty with his right hand which is his dominant hand, David Krueger has mild insecurity but no obvious trembling with the left hand, handwriting is legible, not particularly tremulous, but on the smaller side.)  David Krueger has no significant postural or action tremor, no intention tremor. On fine motor testing David Krueger has minimal difficulty on the right, mild difficulty on the left noted with finger taps and foot taps.  Reflexes are 1+ throughout.   Cerebellar testing: No dysmetria or intention tremor on finger to nose testing. There is no truncal or gait ataxia.  Sensory exam: intact to light touch in the upper and lower extremities.  Gait, station and balance: David Krueger stands without difficulty and posture is mildly stooped for age, slight lean to the R. David Krueger walks with fairly good stride length and pace, but decrease in arm swing on the left. David Krueger turns without problems, balance is well preserved.  Assessment and Plan:   In summary, KEONDRICK DILKS is a very pleasant 69 year old male with an underlying medical history of allergic rhinitis, hyperlipidemia, and anemia, who presents for follow-up consultation of his parkinsonism. His recent DaT scan from 12/01/2018 showed findings supportive of parkinsonian syndrome. David Krueger has a 3-4 year history of sleep disturbance including dream enactment behavior. Motor symptoms date back to about 2 years ago, his history and examination are concerning for parkinsonism, likely left-sided  predominant Parkinson's disease. His history and recent sleep study results are supportive of REM behavior disorder as well. David Krueger was found to have central sleep apnea and has recently started BiPAP therapy for this. We will evaluate his progress in March with compliance data as well. We will talk some more about his sleep studies at the time. I had a long chat with the patient and his wife about His symptoms, my findings and the diagnosis of parkinsonism/Parksinson's disease, and RBD, the prognosis and treatment options. We  talked about medical treatments and non-pharmacological approaches. We talked about the importance of maintaining a healthy lifestyle in general. I encouraged the patient to eat healthy, exercise daily and keep well hydrated, to keep a scheduled bedtime and wake time routine, I again stressed the importance of regular exercise, and to be proctive about constipation. I suggested a Referral to neuro rehabilitation for evaluations with outpatient physical therapy, occupational therapy and speech therapy. I believe David Krueger may benefit from speech therapy at this time. David Krueger is agreeable. Furthermore, we talked about utilizing coenzyme Q 10, David Krueger is encouraged to think about starting coenzyme Q 10 at 400 mg 3 times a day. As far as symptomatic prescription medication for parkinsonism or Parkinson's disease I suggested we try him on low-dose once daily long-acting Requip starting at 2 mg strength once daily with build up to 4 mg once daily after a week. We talked about potential side effects, expectations, limitations and the rare possibility of dopamine dysregulation syndrome, also known as impulse control disorder with these types of meds. David Krueger has an appointment pending in March, we will reconvene then and potentially increase his Requip at the time as well. I answered all their questions today and the patient and his wife were in agreement with the plan. I spent 40 minutes in total face-to-face time with the  patient, more than 50% of which was spent in counseling and coordination of care, reviewing test results, reviewing medication and discussing or reviewing the diagnosis of PD, CSA, RBD, the prognosis and treatment options. Pertinent laboratory and imaging test results that were available during this visit with the patient were reviewed by me and considered in my medical decision making (see chart for details).

## 2018-12-08 DIAGNOSIS — E785 Hyperlipidemia, unspecified: Secondary | ICD-10-CM | POA: Diagnosis not present

## 2018-12-08 DIAGNOSIS — D649 Anemia, unspecified: Secondary | ICD-10-CM | POA: Diagnosis not present

## 2018-12-08 DIAGNOSIS — G20 Parkinson's disease: Secondary | ICD-10-CM | POA: Diagnosis not present

## 2018-12-28 DIAGNOSIS — E785 Hyperlipidemia, unspecified: Secondary | ICD-10-CM | POA: Diagnosis not present

## 2019-01-02 DIAGNOSIS — G4731 Primary central sleep apnea: Secondary | ICD-10-CM | POA: Diagnosis not present

## 2019-01-07 DIAGNOSIS — D509 Iron deficiency anemia, unspecified: Secondary | ICD-10-CM | POA: Diagnosis not present

## 2019-01-07 DIAGNOSIS — E785 Hyperlipidemia, unspecified: Secondary | ICD-10-CM | POA: Diagnosis not present

## 2019-01-10 ENCOUNTER — Ambulatory Visit: Payer: PPO | Admitting: Speech Pathology

## 2019-01-10 ENCOUNTER — Encounter: Payer: Self-pay | Admitting: Physical Therapy

## 2019-01-10 ENCOUNTER — Ambulatory Visit: Payer: PPO | Attending: Neurology | Admitting: Physical Therapy

## 2019-01-10 ENCOUNTER — Encounter: Payer: Self-pay | Admitting: Occupational Therapy

## 2019-01-10 ENCOUNTER — Encounter: Payer: Self-pay | Admitting: Speech Pathology

## 2019-01-10 ENCOUNTER — Ambulatory Visit: Payer: PPO | Admitting: Occupational Therapy

## 2019-01-10 ENCOUNTER — Other Ambulatory Visit: Payer: Self-pay

## 2019-01-10 DIAGNOSIS — R2681 Unsteadiness on feet: Secondary | ICD-10-CM

## 2019-01-10 DIAGNOSIS — R471 Dysarthria and anarthria: Secondary | ICD-10-CM

## 2019-01-10 DIAGNOSIS — R29898 Other symptoms and signs involving the musculoskeletal system: Secondary | ICD-10-CM | POA: Diagnosis not present

## 2019-01-10 DIAGNOSIS — R29818 Other symptoms and signs involving the nervous system: Secondary | ICD-10-CM

## 2019-01-10 DIAGNOSIS — R278 Other lack of coordination: Secondary | ICD-10-CM

## 2019-01-10 DIAGNOSIS — R293 Abnormal posture: Secondary | ICD-10-CM | POA: Insufficient documentation

## 2019-01-10 DIAGNOSIS — R251 Tremor, unspecified: Secondary | ICD-10-CM

## 2019-01-10 DIAGNOSIS — R2689 Other abnormalities of gait and mobility: Secondary | ICD-10-CM

## 2019-01-10 NOTE — Therapy (Signed)
[facility] [address] [place], [place], [address] Phone: [phone]   Fax:  [phone]  Occupational Therapy Evaluation  Patient Details  Name: David Krueger MRN: [id number] Date of Birth: [birth year] Referring Provider (OT): Dr. [name]   Encounter Date: 01/10/2019  OT End of Session - 01/10/19 2238    Visit Number  1    Number of Visits  13    Date for OT Re-Evaluation  02/24/19    Authorization Type  [employer] (Medicare guidelines)    Authorization Time Period  cert. 01/10/19-04/11/19    Authorization - Visit Number  1    Authorization - Number of Visits  10    OT Start Time  1150    OT Stop Time  1235    OT Time Calculation (min)  45 min    Activity Tolerance  Patient tolerated treatment well    Behavior During Therapy  WFL for tasks assessed/performed       Past Medical History:  Diagnosis Date  . Allergic rhinitis   . Hypercholesterolemia     Past Surgical History:  Procedure Laterality Date  . VASECTOMY      There were no vitals filed for this visit.  Subjective Assessment - 01/10/19 1154    Subjective   Pt reports that his speech has improved with meds but has not noticed change in movement.  Wife reports that she and others have noticed changes in pt's walking and posture.    Patient is accompanied by:  Family member   wife   Pertinent History  Newly diagnosed PD (11/19).  PMH:  high cholesterol, primary central sleep apnea    Patient Stated Goals  establish HEP, prevent future problems    Currently in Pain?  No/denies        [facility] OT Assessment - 01/10/19 1154      Assessment   Medical Diagnosis  Parkinson's Disease    Referring Provider (OT)  Dr. [name]    Onset Date/Surgical Date  --   09/2018   Hand Dominance  Right    Prior Therapy  none for PD      Precautions   Precautions  Fall      Balance Screen   Has the patient fallen in the past 6 months  No      Home   Environment   Family/patient expects to be discharged to:  Private residence    Lives With  Spouse      Prior Function   Level of Independence  Independent    Vocation  Retired    Clinical cytogeneticist    Leisure  goes to gym 2x/week, plays golf, crosswords, yardwork (not mowing)      ADL   Eating/Feeding  Modified independent    Grooming  Modified independent    Upper Body Bathing  Modified independent    Lower Body Bathing  Modified independent    Upper Body Dressing  --   min difficulty with buttons   Lower Body Dressing  Modified independent    Toilet Transfer  Modified independent    Toileting - Clothing Manipulation  Modified independent    Toileting -  Hygiene  Modified Independent    Tub/Shower Transfer  Modified independent    Transfers/Ambulation Related to ADL's  independent      IADL   Prior Level of Function Light Housekeeping  vacuuming, cleans tub    Prior Level of Function Meal  Prep  snack prep    Community Mobility  Drives own vehicle    Medication Management  Is responsible for taking medication in correct dosages at correct time    Physicist, medical financial matters independently (budgets, writes checks, pays rent, bills goes to bank), collects and keeps track of income      Mobility   Mobility Status  Independent      Written Expression   Dominant Hand  Right    Handwriting  Mild micrographia;100% legible      Vision - History   Baseline Vision  Wears glasses all the time    Additional Comments  denies deficits      Cognition   Overall Cognitive Status  Within Functional Limits for tasks assessed    Memory  --   possible mild changes per pt/wife   Awareness  Impaired    Awareness Impairment  --   decr for PD-related deficits     Observation/Other Assessments   Standing Functional Reach Test  R-14", L-13"    Other Surveys   Select    Physical Performance Test    Yes    Simulated Eating Time (seconds)  13.18   holds spoon  at end   [employer] Time (seconds)  13.56   small base of support, decr trunk rotation   Donning Doffing Jacket Comments  fastening/unfastening buttons in 44.65sec      Posture/Postural Control   Posture/Postural Control  Postural limitations    Postural Limitations  Rounded Shoulders;Forward head      Coordination   9 Hole Peg Test  Right;Left    Right 9 Hole Peg Test  25.63    Left 9 Hole Peg Test  30.91   bradykinesia with grasp   Box and Blocks  R-49blocks, L-49blocks (hypokinesia with grasp)      Tone   Assessment Location  Right Upper Extremity;Left Upper Extremity      ROM / Strength   AROM / PROM / Strength  AROM      AROM   Overall AROM Comments  mild decr L elbow extension with end range shoulder movement                      OT Education - 01/10/19 2235    Education Details  OT eval results/POC; purpose of therapy in early stages of PD    Person(s) Educated  Patient;Spouse    Methods  Explanation    Comprehension  Verbalized understanding       OT Short Term Goals - 01/10/19 2306      OT SHORT TERM GOAL #1   Title  Pt will be independent with PD-specific HEP.--check STGs 02/10/16    Time  4    Period  Weeks    Status  New      OT SHORT TERM GOAL #2   Title  Pt will veralize understanding of ways to decr risk of future complcations related to PD.    Time  4    Period  Weeks    Status  New      OT SHORT TERM GOAL #3   Title  Pt will verbalize understanding of appropriate PD-related community resources.    Time  4    Period  Weeks    Status  New        OT Long Term Goals - 01/10/19 2309      OT LONG TERM GOAL #1  Title  Pt will verbalize understanding of adaptive strategies to incr ease and decr risk of future complications with ADLs/IADLs.    Time  6    Period  Weeks    Status  New      OT LONG TERM GOAL #2   Title  Pt will improve bilateral hand coordination and incr ease with dressing as shown by  fastening/unfastening 3 buttons in less than or equal to 38sec.    Baseline  44.65sec    Time  6    Period  Weeks    Status  New      OT LONG TERM GOAL #3   Title  Pt will improve bilateral coordination/functional reaching as shown by improving score on box and blocks test by at least 3 bilaterally.    Baseline  R-49, L-49blocks    Time  6    Period  Weeks    Status  New      OT LONG TERM GOAL #4   Title  Pt will improve dressing ease as shown by improving PPT#4 by at least 3sec.    Baseline  13.56sec    Time  6    Period  Weeks    Status  New      OT LONG TERM GOAL #5   Title  Pt will improve coordination for ADLs as shown by improving score on 9-hole peg test by at least 3sec with L hand.    Baseline  L-30.91sec    Time  6    Period  Weeks    Status  New            Plan - 01/10/19 2242    Clinical Impression Statement  Pt is a 68 y.o. male with newly diagnosed Parkinson's Disease (diagnosed 09/2018 with DAT scan 11/2018).  Pt with PMH that includes:  high cholesterol and primary central sleep apnea.  Pt presents with bradykinesia, mild rigidity, abnormal posture, decr functional mobility/balance, tremor, decr coordination, and decr awareness of PD-related deficits.  Pt would benefit from occupational therapy to address these deficits for improved ADL/IADL performance, improved UE functional use, to establish PD-specific HEP, and to reduce risk of future complications related to PD.       OT Occupational Profile and History  Detailed Assessment- Review of Records and additional review of physical, cognitive, psychosocial history related to current functional performance    Occupational performance deficits (Please refer to evaluation for details):  ADL's;IADL's;Leisure;Social Participation    Body Structure / Function / Physical Skills  ADL;Coordination;GMC;UE functional use;Balance;Decreased knowledge of use of DME;IADL;Dexterity;FMC;Mobility;ROM;Tone    Cognitive Skills   Perception    Rehab Potential  Good    Clinical Decision Making  Several treatment options, min-mod task modification necessary    Comorbidities Affecting Occupational Performance:  None    Modification or Assistance to Complete Evaluation   No modification of tasks or assist necessary to complete eval    OT Frequency  2x / week    OT Duration  6 weeks   +eval   OT Treatment/Interventions  Self-care/ADL training;Therapeutic exercise;Patient/family education;Neuromuscular education;Moist Heat;Functional Development worker, community;Therapeutic activities;Balance training;Cognitive remediation/compensation;Manual Therapy;DME and/or AE instruction;Cryotherapy    Plan  begin HEP instruction and education regarding imprtance of large amplitude movement strateies    Consulted and Agree with Plan of Care  Family member/caregiver;Patient    Family Member Consulted  wife       Patient will benefit from skilled therapeutic intervention in order to improve the following  deficits and impairments:  Body Structure / Function / Physical Skills, Psychosocial Skills, Cognitive Skills  Visit Diagnosis: Other symptoms and signs involving the nervous system  Other symptoms and signs involving the musculoskeletal system  Other lack of coordination  Abnormal posture  Unsteadiness on feet  Other abnormalities of gait and mobility  Tremor    Problem List There are no active problems to display for this patient.   [facility] 01/10/2019, 11:24 PM   [facility] [address] [place], [place], [address] Phone: [phone]   Fax:  [phone]  Name: David Krueger MRN: [id number] Date of Birth: 1951-10-21   Willa Frater, OTR/L [facility] [address] [place], [place]  [address] [phone] phone [phone] 01/10/19 11:24 PM

## 2019-01-10 NOTE — Patient Instructions (Signed)
  Take a big breath to power your voice  Loud AH! 5x twice a day  Followed by 5 minutes of loud reading - breathe at periods  You should feel like you are being a little too loud  Yes, you will feel awkward or silly  [email or web address]  [email or web address]

## 2019-01-10 NOTE — Therapy (Signed)
[facility] [address] [place], [place], [address] Phone: [phone]   Fax:  [phone]  Physical Therapy Evaluation  Patient Details  Name: David Krueger MRN: [id number] Date of Birth: 1950/12/05 Referring Provider (PT): Frances Furbish   Encounter Date: 01/10/2019  PT End of Session - 01/10/19 1936    Visit Number  1    Number of Visits  9    Date for PT Re-Evaluation  03/11/19    Authorization Type  [employer]-Will need 10th visit progress note    PT Start Time  1318    PT Stop Time  1402    PT Time Calculation (min)  44 min    Activity Tolerance  Patient tolerated treatment well    Behavior During Therapy  [facility] for tasks assessed/performed       Past Medical History:  Diagnosis Date  . Allergic rhinitis   . Hypercholesterolemia     Past Surgical History:  Procedure Laterality Date  . VASECTOMY      There were no vitals filed for this visit.   Subjective Assessment - 01/10/19 1319    Subjective  Want to try to slow the progression of PD with physical therapy.  Pt notes tremor in L hand, but otherwise, doesn't notice the posture changes and shuffling, "blank stare" on face.  No falls.    Patient is accompained by:  Family member   wife   Patient Stated Goals  Wife:  gait and stride length improve and to stop dragging feet; pt:  to slow progression of disease    Currently in Pain?  No/denies         [facility] PT Assessment - 01/10/19 1323      Assessment   Medical Diagnosis  Parkinson's Disease    Referring Provider (PT)  Athar    Hand Dominance  Right      Precautions   Precautions  Fall      Balance Screen   Has the patient fallen in the past 6 months  No    Has the patient had a decrease in activity level because of a fear of falling?   No    Is the patient reluctant to leave their home because of a fear of falling?   No      Home Environment   Living Environment  Private residence    Living  Arrangements  Spouse/significant other    Available Help at Discharge  Family    Type of Home  House    Home Access  Stairs to enter   From back:  threshold into home   Entrance Stairs-Number of Steps  2   then 2 steps den into home.   Entrance Stairs-Rails  None    Home Layout  Two level;Bed/bath upstairs    Home Equipment  None      Prior Function   Level of Independence  Independent    Vocation  Retired    Clinical cytogeneticist    Leisure  goes to gym 2x/wk, golf 2-3x/wk, yardwork, runs errand; enjoys going to [employer], reading      Observation/Other Assessments   Focus on Therapeutic Outcomes (FOTO)   NA      Posture/Postural Control   Posture/Postural Control  Postural limitations    Postural Limitations  Rounded Shoulders;Forward head      Tone   Assessment Location  Right Lower Extremity;Left Lower Extremity      ROM / Strength  AROM / PROM / Strength  Strength      Strength   Overall Strength  Within functional limits for tasks performed      Transfers   Transfers  Sit to Stand;Stand to Sit    Sit to Stand  6: Modified independent (Device/Increase time);Without upper extremity assist;From chair/3-in-1    Five time sit to stand comments   10.16    Stand to Sit  6: Modified independent (Device/Increase time);Without upper extremity assist;To chair/3-in-1      Ambulation/Gait   Ambulation/Gait  Yes    Ambulation/Gait Assistance  7: Independent    Ambulation Distance (Feet)  200 Feet    Assistive device  None    Gait Pattern  Step-through pattern;Decreased arm swing - left;Decreased step length - left;Decreased trunk rotation    Ambulation Surface  Level;Indoor    Gait velocity  8.85 sec = 3.71 ft/sec      Standardized Balance Assessment   Standardized Balance Assessment  Timed Up and Go Test      Timed Up and Go Test   Normal TUG (seconds)  12.06    Manual TUG (seconds)  10.44    Cognitive TUG (seconds)  11.25    TUG Comments  Scores >13.5-15 sec  indicate increased fall risk.      High Level Balance   High Level Balance Comments  MiniBESTest:  25/28 (see full note for details)      RLE Tone   RLE Tone  Within Functional Limits      LLE Tone   LLE Tone  Mild          Mini-BESTest: Balance Evaluation Systems Test  2005-2013 [facility] & [employer]. All rights reserved.4 ________________________________________________________________________________________Anticipatory_________Subscore__5__/6 1. SIT TO STAND Instruction: "Cross your arms across your chest. Try not to use your hands unless you must.Do not let your legs lean against the back of the chair when you stand. Please stand up now." X(2) Normal: Comes to stand without use of hands and stabilizes independently. (1) Moderate: Comes to stand WITH use of hands on first attempt. (0) Severe: Unable to stand up from chair without assistance, OR needs several attempts with use of hands. 2. RISE TO TOES Instruction: "Place your feet shoulder width apart. Place your hands on your hips. Try to rise as high as you can onto your toes. I will count out loud to 3 seconds. Try to hold this pose for at least 3 seconds. Look straight ahead. Rise now." (2) Normal: Stable for 3 s with maximum height. X(1) Moderate: Heels up, but not full range (smaller than when holding hands), OR noticeable instability for 3 s. (0) Severe: < 3 s. 3. STAND ON ONE LEG Instruction: "Look straight ahead. Keep your hands on your hips. Lift your leg off of the ground behind you without touching or resting your raised leg upon your other standing leg. Stay standing on one leg as long as you can. Look straight ahead. Lift now." Left: Time in Seconds Trial 1:__20___Trial 2:_19.28____ X(2) Normal: 20 s. (1) Moderate: < 20 s. (0) Severe: Unable. Right: Time in Seconds Trial 1:__15.63___Trial 2:__20___ X(2) Normal: 20 s. (1) Moderate: < 20 s. (0) Severe: Unable To score each side separately use  the trial with the longest time. To calculate the sub-score and total score use the side [left or right] with the lowest numerical score [i.e. the worse side]. ______________________________________________________________________________________Reactive Postural Control___________Subscore:___5__/6 4. COMPENSATORY STEPPING CORRECTION- FORWARD Instruction: "Stand with your feet shoulder width apart,  arms at your sides. Lean forward against my hands beyond your forward limits. When I let go, do whatever is necessary, including taking a step, to avoid a fall." X(2) Normal: Recovers independently with a single, large step (second realignment step is allowed). (1) Moderate: More than one step used to recover equilibrium. (0) Severe: No step, OR would fall if not caught, OR falls spontaneously. 5. COMPENSATORY STEPPING CORRECTION- BACKWARD Instruction: "Stand with your feet shoulder width apart, arms at your sides. Lean backward against my hands beyond your backward limits. When I let go, do whatever is necessary, including taking a step, to avoid a fall." X(2) Normal: Recovers independently with a single, large step. (1) Moderate: More than one step used to recover equilibrium. (0) Severe: No step, OR would fall if not caught, OR falls spontaneously. 6. COMPENSATORY STEPPING CORRECTION- LATERAL Instruction: "Stand with your feet together, arms down at your sides. Lean into my hand beyond your sideways limit. When I let go, do whatever is necessary, including taking a step, to avoid a fall." Left X(2) Normal: Recovers independently with 1 step (crossover or lateral OK). (1) Moderate: Several steps to recover equilibrium. (0) Severe: Falls, or cannot step. Right (2) Normal: Recovers independently with 1 step (crossover or lateral OK). X(1) Moderate: Several steps to recover equilibrium. (0) Severe: Falls, or cannot step. Use the side with the lowest score to calculate sub-score and total  score. ____________________________________________________________________________________Sensory Orientation_____________Subscore:___6______/6 7. STANCE (FEET TOGETHER); EYES OPEN, FIRM SURFACE Instruction: "Place your hands on your hips. Place your feet together until almost touching. Look straight ahead. Be as stable and still as possible, until I say stop." Time in seconds:________ X(2) Normal: 30 s. (1) Moderate: < 30 s. (0) Severe: Unable. 8. STANCE (FEET TOGETHER); EYES CLOSED, FOAM SURFACE Instruction: "Step onto the foam. Place your hands on your hips. Place your feet together until almost touching. Be as stable and still as possible, until I say stop. I will start timing when you close your eyes." Time in seconds:________ X(2) Normal: 30 s.-Moderate sway (1) Moderate: < 30 s. (0) Severe: Unable. 9. INCLINE- EYES CLOSED Instruction: "Step onto the incline ramp. Please stand on the incline ramp with your toes toward the top. Place your feet shoulder width apart and have your arms down at your sides. I will start timing when you close your eyes." Time in seconds:________ X(2) Normal: Stands independently 30 s and aligns with gravity. (1) Moderate: Stands independently <30 s OR aligns with surface. (0) Severe: Unable. _________________________________________________________________________________________Dynamic Gait ______Subscore_____9___/10 10. CHANGE IN GAIT SPEED Instruction: "Begin walking at your normal speed, when I tell you 'fast', walk as fast as you can. When I say 'slow', walk very slowly." X(2) Normal: Significantly changes walking speed without imbalance. (1) Moderate: Unable to change walking speed or signs of imbalance. (0) Severe: Unable to achieve significant change in walking speed AND signs of imbalance. 11. WALK WITH HEAD TURNS - HORIZONTAL Instruction: "Begin walking at your normal speed, when I say "right", turn your head and look to the right. When I say  "left" turn your head and look to the left. Try to keep yourself walking in a straight line." X(2) Normal: performs head turns with no change in gait speed and good balance. (1) Moderate: performs head turns with reduction in gait speed. (0) Severe: performs head turns with imbalance. 12. WALK WITH PIVOT TURNS Instruction: "Begin walking at your normal speed. When I tell you to 'turn and stop', turn as quickly as you  can, face the opposite direction, and stop. After the turn, your feet should be close together." X(2) Normal: Turns with feet close FAST (< 3 steps) with good balance. (1) Moderate: Turns with feet close SLOW (>4 steps) with good balance. (0) Severe: Cannot turn with feet close at any speed without imbalance. 13. STEP OVER OBSTACLES Instruction: "Begin walking at your normal speed. When you get to the box, step over it, not around it and keep walking." (2) Normal: Able to step over box with minimal change of gait speed and with good balance. X(1) Moderate: Steps over box but touches box OR displays cautious behavior by slowing gait. (0) Severe: Unable to step over box OR steps around box. 14. TIMED UP & GO WITH DUAL TASK [3 METER WALK] Instruction TUG: "When I say 'Go', stand up from chair, walk at your normal speed across the tape on the floor, turn around, and come back to sit in the chair." Instruction TUG with Dual Task: "Count backwards by threes starting at ___. When I say 'Go', stand up from chair, walk at your normal speed across the tape on the floor, turn around, and come back to sit in the chair. Continue counting backwards the entire time." TUG: ________seconds; Dual Task TUG: ________seconds X(2) Normal: No noticeable change in sitting, standing or walking while backward counting when compared to TUG without Dual Task. (1) Moderate: Dual Task affects either counting OR walking (>10%) when compared to the TUG without Dual Task. (0) Severe: Stops counting while  walking OR stops walking while counting. When scoring item 14, if subject's gait speed slows more than 10% between the TUG without and with a Dual Task the score should be decreased by a point. TOTAL SCORE: ____25____/28       Objective measurements completed on examination: See above findings.                   PT Long Term Goals - 01/10/19 1946      PT LONG TERM GOAL #1   Title  Pt will be independent with HEP for improved balance, gait, posture.  TARGET 02/10/2019 (may be delayed due to later start-awaiting scheduling for all 3 disciplines)    Time  4    Period  Weeks    Status  New      PT LONG TERM GOAL #2   Title  Pt will perform side step and release push test (to R side) with 2 or less steps to regain balance.    Time  4    Period  Weeks    Status  New      PT LONG TERM GOAL #3   Title  6 minute walk test to be assessed, with goal to be written as appropriate.    Time  4    Period  Weeks    Status  New      PT LONG TERM GOAL #4   Title  Pt will verbalize understanding of local Parkinson's disease-related resources.    Time  4    Period  Weeks    Status  New      PT LONG TERM GOAL #5   Title  Pt will verbalize plans for appropriate community fitness upon d/c from PT.    Time  4    Period  Weeks    Status  New             Plan - 01/10/19 1937    Clinical Impression  Statement  Pt is a 68 year old male who presents to OPPT with Parkinson's disease, diagnosed November 2019.  He demonstrates decreased awareness of deficits, decreased timing and coordination of gait, decreased balance (increased postural sway on compliant surfaces), postural instability, abnormal posture.  He would benefit from skilled PT to address the above stated deficits to maximize optimal large, deliberate movement patterns of offset bradykinesia, postrual changes, rigidity, and tremors associated with Parkinson's, in order to improve functional mobility and reduce fall risk.     Personal Factors and Comorbidities  Comorbidity 2;Fitness;Time since onset of injury/illness/exacerbation   newly diagnosed 09/2018 with PD   Comorbidities  allergic rhinitis, hypercholesterolemia    Examination-Activity Limitations  Other   Gait-wife reported shuffling gait    Examination-Participation Restrictions  Church;Yard Work;Community Activity;Other   Golfing   Stability/Clinical Decision Making  Evolving/Moderate complexity    Clinical Decision Making  Moderate    Rehab Potential  Good    PT Frequency  2x / week    PT Duration  4 weeks   plus eval   PT Treatment/Interventions  ADLs/Self Care Home Management;Gait training;Stair training;Functional mobility training;Therapeutic activities;Therapeutic exercise;Patient/family education;Neuromuscular re-education;Balance training    PT Next Visit Plan  Initiate PWR! Moves exercises in standing; hip/ankle/step strategy; gait training with focus on arm swing, step length and initiation of walking program; PD-related resource information (in conjunction with OT)   For 2nd visit:  please perform and revise goal   Consulted and Agree with Plan of Care  Family member/caregiver    Family Member Consulted  wife, Corrie Dandy       Patient will benefit from skilled therapeutic intervention in order to improve the following deficits and impairments:  Abnormal gait, Decreased balance, Difficulty walking, Decreased mobility, Impaired tone, Postural dysfunction  Visit Diagnosis: Other abnormalities of gait and mobility  Unsteadiness on feet  Abnormal posture  Other symptoms and signs involving the nervous system     Problem List There are no active problems to display for this patient.   , W. 01/10/2019, 7:57 PM  Gean Maidens., PT   [facility] [address] [place], [place], [address] Phone: [phone]   Fax:  [phone]  Name: TYREIK TRAYER MRN:  [id number] Date of Birth: 1951-07-15

## 2019-01-12 NOTE — Therapy (Signed)
[facility] [address] [place], [place], [address] Phone: [phone]   Fax:  [phone]  Speech Language Pathology Evaluation  Patient Details  Name: David Krueger MRN: [id number] Date of Birth: [birth year] No data recorded  Encounter Date: 01/10/2019  End of Session - 01/12/19 0844    Visit Number  1    Number of Visits  13    Date for SLP Re-Evaluation  02/24/19    SLP Start Time  1231    SLP Stop Time   1310    SLP Time Calculation (min)  39 min    Activity Tolerance  Patient tolerated treatment well       Past Medical History:  Diagnosis Date  . Allergic rhinitis   . Hypercholesterolemia     Past Surgical History:  Procedure Laterality Date  . VASECTOMY      There were no vitals filed for this visit.  Subjective Assessment - 01/12/19 0840    Subjective  "I was talking so low and hoarse and didn't have a voice"    Patient is accompained by:  Family member   spouse        SLP Evaluation [facility] - 01/12/19 0840      SLP Visit Information   SLP Received On  01/10/19    Onset Date  10/2018 - dx; sx for 2 years    Medical Diagnosis  Parkinson's Disease      Subjective   Patient/Family Stated Goal  "Get better"      General Information   HPI   Newly diagnosed PD (11/19).  PMH:  high cholesterol, primary central sleep apnea     Mobility Status  walks independently      Balance Screen   Has the patient fallen in the past 6 months  No    Has the patient had a decrease in activity level because of a fear of falling?   No    Is the patient reluctant to leave their home because of a fear of falling?   No      Prior Functional Status   Cognitive/Linguistic Baseline  Baseline deficits    Baseline deficit details  mild memory    Type of Home  House     Lives With  Spouse    Available Support  Friend(s);Family    Vocation  Retired      Copy Status  Within Functional  Limits for tasks assessed      Oral Motor/Sensory Function   Overall Oral Motor/Sensory Function  Appears within functional limits for tasks assessed    Velum  Within Functional Limits    Mandible  Within Functional Limits      Motor Speech   Overall Motor Speech  Impaired    Respiration  Impaired    Level of Impairment  Conversation    Phonation  Low vocal intensity    Resonance  Within functional limits    Articulation  Within functional limitis    Intelligibility  Intelligibility reduced    Word  75-100% accurate    Phrase  75-100% accurate    Sentence  75-100% accurate    Conversation  75-100% accurate    Motor Planning  Witnin functional limits    Effective Techniques  Increased vocal intensity                        SLP Short Term Goals - 01/12/19  1038      SLP SHORT TERM GOAL #1   Title  Pt will achieve 90dB on loud /a/ with rare min A over 3 sessions    Time  3    Period  Weeks    Status  New      SLP SHORT TERM GOAL #2   Title  Pt will average 72dB in structured sentece responses 12/15 sentences with rare min A over 2 sessions    Time  3    Period  Weeks    Status  New      SLP SHORT TERM GOAL #3   Title  Pt will converse over 5 minute simple conversation with average of 70dB with rare min A over 2 sessions    Time  3    Period  Weeks    Status  New       SLP Long Term Goals - 01/12/19 1039      SLP LONG TERM GOAL #1   Title  Pt will report carryover of HEP for dysarthria twice a day over 5 sessions    Time  6    Period  Weeks    Status  New      SLP LONG TERM GOAL #2   Title  Pt will achieve loud /a/ with 90dB average over 3 sessions with supervision cues    Time  6    Period  Weeks    Status  New      SLP LONG TERM GOAL #3   Title  Pt will average 70dB over 15 minute conversation with rare min A over 2 sessions    Time  6    Period  Weeks    Status  New      SLP LONG TERM GOAL #4   Title  Pt will be 100% intelligible over  15 minute conversation in noisy environment outside of treatment room with rare min A over 2 sessions    Time  6    Period  Weeks    Status  New      SLP LONG TERM GOAL #5   Title  Pt and spouse will verbalize 3 s/s of aspiration pna    Time  6    Period  Weeks    Status  New       Plan - 01/12/19 1030    Clinical Impression Statement  Mr. Scarpati is newly dx with Parkinson's Disease - he and his spouse report that pt has had poor volume, hoarse voice, and aphonia - ENT eval WNL, after PD dx and initiated PD meds, voice and volume are much improved per sposue. Prior to starting Requip, spouse states she was frequently frustrated trying to communicate and understand her husband.  They report pt is mostly intelligilble at this time. Today, he presents with mild hypokinetic dysarthria.  Oral motor assessment revealed WNL lingual ROM and WNL lingual strength. Labial ROM was WNL and strength was WNL. Velar ROM appeared WNL   Measured when a sound level meter was placed 30 cm away from pt's mouth, 8 minutes of conversational speech was reduced today, at average 65dB (WNL= average 70-72dB) with range of 62 to 67dB. Overall speech intelligibility for this listener in a quiet environment was not affected, at approx 100%. Production of loud /a/ averaged 89dB (range of 87 to 92) and min cues usually needed for loudness.   Pt then rated effort level at 7/10 for production of loud /a/ (10=maximal  effort). In sentenece reading task pt was asked to use the same amount of effort as with loud /a/. Loudness average with this increased effort was 75dB (range of 73 to 77) with min A occasionally for loudness. Pt would benefit from skilled ST in order to improve speech intelligibility and pt's QOL, as well as provide PD education  Pt and spouse deny difficulty swallowing at this time    Speech Therapy Frequency  2x / week    Duration  --   6 weeks or 13 visits   Treatment/Interventions  Aspiration precaution  training;Cueing hierarchy;Internal/external aids;Compensatory techniques;Functional tasks;SLP instruction and feedback;Patient/family education;Environmental controls;Compensatory strategies;Multimodal communcation approach    Potential to Achieve Goals  Good    SLP Home Exercise Plan  Loud AH followed by 5 min reading focusing on volume and breath support twice a day    Consulted and Agree with Plan of Care  Patient;Family member/caregiver    Family Member Consulted  spouse       Patient will benefit from skilled therapeutic intervention in order to improve the following deficits and impairments:   Dysarthria and anarthria    Problem List There are no active problems to display for this patient.   Pasha Broad, Radene Journey MS, CCC-SLP 01/12/2019, 10:43 AM  [facility] [address] [place], [place], [address] Phone: [phone]   Fax:  [phone]  Name: LADARIUS SEUBERT MRN: [id number] Date of Birth: November 21, 1950

## 2019-01-23 ENCOUNTER — Ambulatory Visit: Payer: PPO | Admitting: Neurology

## 2019-01-30 ENCOUNTER — Telehealth: Payer: Self-pay | Admitting: Physical Therapy

## 2019-01-30 NOTE — Telephone Encounter (Signed)
Left message for Mr. Lowman regarding clinic closure due to Covid-19:  For patient to call with any concerns regarding PT, OT, speech therapy.  Left message regarding possibility of virtual check-in or telehealth visit if patient would be interested, to call back to clinic.  Lonia Blood, PT 01/30/19 9:57 AM Phone: [phone] Fax: [phone]

## 2019-01-31 ENCOUNTER — Ambulatory Visit: Payer: PPO | Admitting: Speech Pathology

## 2019-01-31 ENCOUNTER — Ambulatory Visit: Payer: PPO | Admitting: Physical Therapy

## 2019-01-31 ENCOUNTER — Ambulatory Visit: Payer: PPO | Admitting: Occupational Therapy

## 2019-01-31 DIAGNOSIS — G4731 Primary central sleep apnea: Secondary | ICD-10-CM | POA: Diagnosis not present

## 2019-02-01 ENCOUNTER — Encounter: Payer: Self-pay | Admitting: Neurology

## 2019-02-02 ENCOUNTER — Ambulatory Visit: Payer: PPO | Admitting: Physical Therapy

## 2019-02-02 ENCOUNTER — Telehealth: Payer: Self-pay

## 2019-02-02 ENCOUNTER — Ambulatory Visit: Payer: PPO | Admitting: Speech Pathology

## 2019-02-02 NOTE — Telephone Encounter (Signed)
Pt returned RN's call °

## 2019-02-02 NOTE — Telephone Encounter (Signed)
Due to current COVID 19 pandemic, our office is severely reducing in office visits for at least the next 2 weeks, in order to minimize the risk to our patients and healthcare providers.   I called pt to discuss. No answer, left a message asking pt to call me back.

## 2019-02-02 NOTE — Telephone Encounter (Signed)
I called pt and and discussed this. Pt is agreeable to a virtual visit during his appt time on Monday.  Pt understands that although there may be some limitations with this type of visit, we will take all precautions to reduce any security or privacy concerns.  Pt understands that this will be treated like an in office visit and we will file with pt's insurance, and there may be a patient responsible charge related to this service.  Pt's email is [email or web address] Pt understands that the [employer] webex software must be downloaded and operational on the device pt plans to use for the visit.  Pt's meds, allergies, and PMH were updated. Pt is taking 2 tablets of requip.

## 2019-02-06 ENCOUNTER — Ambulatory Visit: Payer: Self-pay | Admitting: Neurology

## 2019-02-07 ENCOUNTER — Encounter: Payer: PPO | Admitting: Occupational Therapy

## 2019-02-07 ENCOUNTER — Encounter: Payer: PPO | Admitting: Speech Pathology

## 2019-02-07 ENCOUNTER — Ambulatory Visit: Payer: PPO | Admitting: Physical Therapy

## 2019-02-09 ENCOUNTER — Encounter: Payer: PPO | Admitting: Speech Pathology

## 2019-02-09 ENCOUNTER — Ambulatory Visit (INDEPENDENT_AMBULATORY_CARE_PROVIDER_SITE_OTHER): Payer: PPO | Admitting: Neurology

## 2019-02-09 ENCOUNTER — Encounter: Payer: PPO | Admitting: Occupational Therapy

## 2019-02-09 ENCOUNTER — Encounter: Payer: Self-pay | Admitting: Neurology

## 2019-02-09 ENCOUNTER — Telehealth: Payer: Self-pay | Admitting: Speech Pathology

## 2019-02-09 ENCOUNTER — Ambulatory Visit: Payer: PPO | Admitting: Physical Therapy

## 2019-02-09 ENCOUNTER — Other Ambulatory Visit: Payer: Self-pay

## 2019-02-09 DIAGNOSIS — F515 Nightmare disorder: Secondary | ICD-10-CM

## 2019-02-09 DIAGNOSIS — G4752 REM sleep behavior disorder: Secondary | ICD-10-CM

## 2019-02-09 DIAGNOSIS — G20 Parkinson's disease: Secondary | ICD-10-CM | POA: Diagnosis not present

## 2019-02-09 DIAGNOSIS — G4731 Primary central sleep apnea: Secondary | ICD-10-CM

## 2019-02-09 MED ORDER — ROPINIROLE HCL ER 2 MG PO TB24: 2.0000 mg | ORAL_TABLET | Freq: Two times a day (BID) | ORAL | 3 refills | Status: DC

## 2019-02-09 NOTE — Telephone Encounter (Signed)
Mr. Sherling was contacted today regarding temporary reduction of Outpatient Neuro Rehabilitation Services due to concerns for community transmission of COVID-19.  A voicemail was left.   Patient did not have an acute/special need that requires in person visit. Proceeded with phone call.  Therapist advised the patient to continue to perform his/her HEP and assured he/she had no unanswered questions or concerns at this time.    I left message re: patient  interest in being contacted for an E-Visit, virtual check in, or Telehealth visit to continue their plan of care, when those services become available. Pt instructed to call us if he is interested  [facility] will follow up with patient at that time.  OR  [facility] will follow up with this client when we are able to safely resume care at the [facility] [address] [facility] in person.    Patient is aware we can be reached by telephone during limited business hours in the meantime.   Clinton Sawyer MS, CCC-SLP

## 2019-02-09 NOTE — Patient Instructions (Signed)
Given verbally, during today's virtual video-based encounter, with verbal feedback received.   

## 2019-02-09 NOTE — Progress Notes (Signed)
Interim history:   David Krueger is a 68 year old right-handed gentleman with an underlying medical history of allergic rhinitis, hyperlipidemia, and anemia, with whom I am contacting a virtual, video based visit via Webex today, for follow-up consultation of his central sleep apnea for which he is on BiPAP, his dream enactment behavior in keeping with RBD and his parkinsonism. The patient is accompanied by his wife today and joins via laptop at home, daughter helped via phone for video troubleshooting. I last saw him on 12/06/2018, at which time we talked about his recent brain SPECT scan, DaT scan, from 12/01/2018 which showed decreased radiotracer activity within the posterior left and right striata. I suggested we start him on ropinirole with gradual increase and I made a referral to neuro rehabilitation. We also talked about utilizing coenzyme Q10 over-the-counter as a supplement. We also talked about his sleep study results.  He had had a baseline sleep study in December 2019 which showed central sleep apnea, he had a BiPAP titration study in January 2020. His second sleep study showed some changes in keeping with REM behavior disorder.  Today, 02/09/2019: Please also see below for documentation of the virtual visit.   I reviewed his BiPAP compliance data from 01/03/2019 through 02/01/2019 which is a total of 30 days, during which time he used his BiPAP every night with percent used days greater than 4 hours at 87%, indicating very good compliance with an average usage of 4 hours and 36 minutes, residual AHI at goal at 1.7 per hour, leak acceptable with the 95th percentile at 7.5 L/m on a pressure of 12/7 with a backup rate of 14.  The patient's allergies, current medications, family history, past medical history, past social history, past surgical history and problem list were reviewed and updated as appropriate.   Previously:  I first met him on 09/21/2001 at the request of his primary care  physician, at which time he reported changes in his gait and posture. He also reported a 3+ year history of vivid dreams and dream enactment behavior. His exam showed evidence of parkinsonism with left-sided lateralization. I suggested further workup for parkinsonism including a DaT scan, and sleep study testing.    He had a nuclear medicine DaT scan on 12/01/2018 and I reviewed the results: IMPRESSION: Decreased radiotracer activity (dopamine transporter populations) within the posterior LEFT and RIGHT striata (putamen) is a pattern suggestive of Parkinson's syndrome pathology.   We called him with his test results.    His baseline sleep study in December 2019 showed moderate primary central sleep apnea. He was advised to return for a BiPAP titration study, which he had in January 2020. During the second sleep study also had some REM related increase in muscle tone and vocalization, in keeping with his history of REM behavior disorder.   09/21/2018: (He) has had some recent changes in his gait and posture. He has had an intermittent tremor, he does not notice his motor changes or posture changes himself but his kids have noticed it and wife has noticed it. He is more concerned about his sleep disturbance. His wife reports that for the past 3+ years he has had intermittent vivid dreams and erratic behaviors and dreams including movements, twitching, and even more dramatic movements such as grabbing her. She sometimes gets scared because of the unpredictable behaviors in his sleep. They still sleep in the same bed together. They have been married 100 years. She has noticed increase in snoring and sometimes apneic pauses  while he is asleep. He sometimes reports vivid dreams to her including fighting with an animal but typically does not have recollection of his dreams. He has a history of Parkinsons disease in paternal grandmother. His own mother lived to be 85 and had trembling or parkinsonism in her 28s.  Of note, their son who is currently 58 was diagnosed with essential tremor when he was a child, maybe as young as 39 years old, no significant progression in the tremor and son has not been on any symptomatic treatment for this. Cognitively, patient is fine. He retired last year, he was a Chief Executive Officer. He is very active physically and socially. He likes to golf about 3 days out of the week. They have 3 children, son is the oldest, they have 1 daughter in [place] and one in [place], [place]; she has 2 kids, ages 23 and 25. I reviewed your office note from 09/09/2018, which you kindly included. His Epworth sleepiness score is 8 out of 24 today, fatigue score is 21 out of 63. He is particularly worried about his sleep disturbance. He is married and lives with his wife, he is a nonsmoker, retired, does not utilize alcohol or caffeine on a regular basis.   His Past Medical History Is Significant For: Past Medical History:  Diagnosis Date   Allergic rhinitis    Hypercholesterolemia     His Past Surgical History Is Significant For: Past Surgical History:  Procedure Laterality Date   VASECTOMY      His Family History Is Significant For: No family history on file.  His Social History Is Significant For: Social History   Socioeconomic History   Marital status: Married    Spouse name: Not on file   Number of children: Not on file   Years of education: Not on file   Highest education level: Not on file  Occupational History   Not on file  Social Needs   Financial resource strain: Not on file   Food insecurity:    Worry: Not on file    Inability: Not on file   Transportation needs:    Medical: Not on file    Non-medical: Not on file  Tobacco Use   Smoking status: Never Smoker   Smokeless tobacco: Never Used  Substance and Sexual Activity   Alcohol use: Never    Frequency: Never   Drug use: Not on file   Sexual activity: Not on file  Lifestyle   Physical activity:    Days per  week: Not on file    Minutes per session: Not on file   Stress: Not on file  Relationships   Social connections:    Talks on phone: Not on file    Gets together: Not on file    Attends religious service: Not on file    Active member of club or organization: Not on file    Attends meetings of clubs or organizations: Not on file    Relationship status: Not on file  Other Topics Concern   Not on file  Social History Narrative   Not on file    His Allergies Are:  No Known Allergies:   His Current Medications Are:  Outpatient Encounter Medications as of 02/09/2019  Medication Sig   Multiple Vitamins-Minerals (ICAPS AREDS 2 PO) Take by mouth.   Multiple Vitamins-Minerals (MULTIVITAMIN WITH MINERALS) tablet Take 1 tablet by mouth daily.   niacin 500 MG tablet Take 500 mg by mouth daily.   Omega-3 Fatty Acids (FISH  OIL) 1000 MG CAPS Take by mouth.   rOPINIRole (REQUIP XL) 2 MG 24 hr tablet Take one pill once daily for 1 week, then take 2 pills daily thereafter.   rosuvastatin (CRESTOR) 10 MG tablet Take 10 mg by mouth at bedtime.   No facility-administered encounter medications on file as of 02/09/2019.   :  Review of Systems:  Out of a complete 14 point review of systems, all are reviewed and negative with the exception of these symptoms as listed below:  Virtual Visit via Video Note on 02/09/2019:  I connected with David Krueger on 02/09/19 at  9:30 AM EDT by a video enabled telemedicine application and verified that I am speaking with the correct person using two identifiers.   I discussed the limitations of evaluation and management by telemedicine and the availability of in person appointments. The patient expressed understanding and agreed to proceed.  History of Present Illness:  He reports doing well, no significant treatment enactments lately. His speech has significantly improved since starting Requip. He tolerates it, takes 2 mg twice daily. His voice is better, he  had had evaluations with neuro rehabilitation on 01/10/2019 including ST, OT and PT. He was given some speech therapy exercises which he practices at home. He has not been able to go to the gym. He has a stationary bike at home and also dumbbells. He does get exhausted on the stationary bike. He also likes art work and plays golf when possible. Constipation is not a big issue currently. He tries to hydrate well with water. He struggles sometimes with the BiPAP, is still trying to adjust to it but overall is motivated to continue.His wife reports that the Requip has made it significant difference and is quite pleased.   Observations/Objective:  His most recent vital signs on file are from 12/06/2018, at which time his blood pressure was 114/79, pulse of 76, weight of 162 with a BMI of 24.63.  On examination, he is pleasant and conversant, no acute distress. He has mild facial masking. He has no obvious lip or jaw tremor. Speech is less hypophonic sounding. Left shoulder is higher than right.  On motor examination, no obvious resting tremor noted today. Fine motor skills with finger taps, he has minimal difficulty on the left, no significant difficulty noted on the right. He stands without problems, Romberg is negative. Posture is fairly Krueger-appropriate, left shoulder height is higher than right. He walks without shuffling, slight decrease in arm swing noted on the left, turns without problems. No festination, no freezing noted.   Assessment and Plan:  In summary,David W Alexanderis a 21 year oldmalewith an underlying medical history of allergic rhinitis, hyperlipidemia, and anemia, whopresents for follow-up consultation of his parkinsonism and central sleep apnea, established on BiPAP ST. He had a DaT scan on 12/01/2018, which showed findings supportive of a parkinsonian syndrome. He has a 3-4 year history of sleep disturbance including dream enactment behavior. Motor symptoms date back to about 2  years ago, his history and examination are concerning for parkinsonism, likely left-sided predominant Parkinson's disease (more so by prior exam, today's exam is limited). His history and recent sleep study results are supportive of REM behavior disorder. He was found to have central sleep apnea and has started BiPAP therapy for this. He is commended for his treatment adherence. I discussed the diagnosis of parkinsonism/Parksinson's disease, and RBD, theprognosis and treatment options again with the patient and his wife. We talked about the importance of maintaining a healthy  lifestyle in the importance of exercise even within the current limitations. He was reminded to exercise regularly and well hydrated, to keep a scheduled bedtime and wake time routine, I again stressed the importance of regular exercise,and to be proctive about constipation. He has had new her rehabilitation evaluations about a month ago. He is doing speech therapy exercises at home and has benefited from taking Requip which we started in our January appointment, currently 2 mg twice a day of the long-acting ropinirole. Overall, he is doing well with the medication and is quite pleased. We mutually agreed to keep him on this low dose. He is reminded to continue with the BiPAP with the current settings which are adequate. He is motivated to continue. I will change his ropinirole prescription to 90 days. He does not quite needed as he just refilled it but we can convert to a 90 day prescription for next time. I suggested a follow-up appointment in clinic hopefully face-to-face in 4 months. I answered all their questions today and the patient and his wife were in agreement.  Follow Up Instructions:  1. Continue BiPAP ST. 2. Continue Requip. 3. FU in 4 months.   I discussed the assessment and treatment plan with the patient. The patient was provided an opportunity to ask questions and all were answered. The patient agreed with the plan  and demonstrated an understanding of the instructions.   The patient was advised to call back or seek an in-person evaluation if the symptoms worsen or if the condition fails to improve as anticipated.  I provided 30 minutes of non-face-to-face time during this encounter.   David Age, MD

## 2019-02-14 ENCOUNTER — Encounter: Payer: PPO | Admitting: Occupational Therapy

## 2019-02-16 ENCOUNTER — Ambulatory Visit: Payer: PPO | Admitting: Physical Therapy

## 2019-02-16 ENCOUNTER — Encounter: Payer: PPO | Admitting: Occupational Therapy

## 2019-02-21 ENCOUNTER — Encounter: Payer: PPO | Admitting: Speech Pathology

## 2019-02-21 ENCOUNTER — Ambulatory Visit: Payer: Self-pay | Admitting: Neurology

## 2019-02-21 ENCOUNTER — Ambulatory Visit: Payer: PPO | Admitting: Physical Therapy

## 2019-02-21 ENCOUNTER — Encounter: Payer: PPO | Admitting: Occupational Therapy

## 2019-02-23 ENCOUNTER — Encounter: Payer: PPO | Admitting: Speech Pathology

## 2019-02-23 ENCOUNTER — Encounter: Payer: PPO | Admitting: Occupational Therapy

## 2019-02-23 ENCOUNTER — Ambulatory Visit: Payer: PPO | Admitting: Physical Therapy

## 2019-03-03 DIAGNOSIS — G4731 Primary central sleep apnea: Secondary | ICD-10-CM | POA: Diagnosis not present

## 2019-03-07 ENCOUNTER — Telehealth: Payer: Self-pay | Admitting: Physical Therapy

## 2019-03-07 NOTE — Telephone Encounter (Signed)
Left message for David Krueger regarding extended Neurorehab clinic closure due to COVID-19.  Reminded patient of possibility of telehealth therapy options and limited clinic hours for in-person visits at this time.  Requested patient call back to our clinic with questions regarding further therapy needs.  Lonia Blood, PT 03/07/19 12:12 PM Phone: [phone] Fax: [phone]

## 2019-03-16 DIAGNOSIS — G4731 Primary central sleep apnea: Secondary | ICD-10-CM | POA: Diagnosis not present

## 2019-04-02 DIAGNOSIS — G4731 Primary central sleep apnea: Secondary | ICD-10-CM | POA: Diagnosis not present

## 2019-04-05 ENCOUNTER — Ambulatory Visit: Payer: PPO | Attending: Neurology | Admitting: Occupational Therapy

## 2019-04-05 ENCOUNTER — Encounter: Payer: Self-pay | Admitting: Physical Therapy

## 2019-04-05 ENCOUNTER — Other Ambulatory Visit: Payer: Self-pay

## 2019-04-05 ENCOUNTER — Ambulatory Visit: Payer: PPO | Admitting: Physical Therapy

## 2019-04-05 DIAGNOSIS — R29898 Other symptoms and signs involving the musculoskeletal system: Secondary | ICD-10-CM | POA: Diagnosis not present

## 2019-04-05 DIAGNOSIS — R29818 Other symptoms and signs involving the nervous system: Secondary | ICD-10-CM

## 2019-04-05 DIAGNOSIS — R2689 Other abnormalities of gait and mobility: Secondary | ICD-10-CM

## 2019-04-05 DIAGNOSIS — R278 Other lack of coordination: Secondary | ICD-10-CM | POA: Diagnosis not present

## 2019-04-05 DIAGNOSIS — R2681 Unsteadiness on feet: Secondary | ICD-10-CM | POA: Diagnosis not present

## 2019-04-05 DIAGNOSIS — R293 Abnormal posture: Secondary | ICD-10-CM

## 2019-04-05 NOTE — Addendum Note (Signed)
Addended by: Gean Maidens on: 04/05/2019 12:07 PM   Modules accepted: Orders

## 2019-04-05 NOTE — Therapy (Signed)
[facility] [address] [place], [place], [address] Phone: [phone]   Fax:  [phone]  Physical Therapy Treatment  Patient Details  Name: David Krueger MRN: [id number] Date of Birth: [birth year] Referring Provider (PT): Athar   Encounter Date: 04/05/2019  CLINIC OPERATION CHANGES: Outpatient Neuro Rehabilitation Clinic is operating at a low capacity due to COVID-19.  The patient was brought into the clinic for evaluation and/or treatment following universal masking by staff, social distancing, and <10 people in the clinic.  The patient's COVID risk of complications score is 2.   PT End of Session - 04/05/19 1016    Visit Number  2    Number of Visits  9    Date for PT Re-Evaluation  06/04/19   per renewal 04/05/2019   Authorization Type  [employer]-Will need 10th visit progress note    PT Start Time  0931    PT Stop Time  1013    PT Time Calculation (min)  42 min    Activity Tolerance  Patient tolerated treatment well    Behavior During Therapy  [facility] for tasks assessed/performed       Past Medical History:  Diagnosis Date  . Allergic rhinitis   . Hypercholesterolemia     Past Surgical History:  Procedure Laterality Date  . VASECTOMY      There were no vitals filed for this visit.  Subjective Assessment - 04/05/19 0932    Subjective  Been doing yardwork and walking for exercise.  Family has stayed healthy.  No falls.  Prior to Covid, [facility] and [facility]; have been using [employer] bike at home.    Patient is accompained by:  Family member   wife   Patient Stated Goals  Wife:  gait and stride length improve and to stop dragging feet; pt:  to slow progression of disease    Currently in Pain?  No/denies         [facility] Assessment - 04/05/19 0001      6 minute walk test results    Aerobic Endurance Distance Walked  1627    Endurance additional comments  1st  minute:  265 ft, 6th minute:  172 ft; decreased arm swing throughout                   [facility] Treatment/Exercise - 04/05/19 0001      Ambulation/Gait   Ambulation/Gait  Yes    Ambulation/Gait Assistance  7: Independent    Ambulation Distance (Feet)  1627 Feet   additional 230 ft with cues for arm swing, posture   Assistive device  None    Gait Pattern  Step-through pattern;Decreased arm swing - left;Decreased step length - left;Decreased trunk rotation;Decreased arm swing - right    Ambulation Surface  Level;Indoor    Gait Comments  Following 6MWT, provided cues for increased arm swing and upright posture.  PT provides tactile cues through shoulders for gentle trunk rotation to initiate reciprocal arm swing      Self-Care   Self-Care  Other Self-Care Comments    Other Self-Care Comments   Discussed background and premise, benefits of exercise in relation to Parkinson's disease, including aerobic activity, PWR! Moves and relation to function, walking for exercise.  Discussed exercise for medication.        Pt performs PWR! Moves in Standing position x 20 reps   PWR! Up for improved posture  PWR! Rock for improved weighshifting  PWR! Twist for improved trunk rotation   PWR! Step for improved step initiation   Verbal and visual cues provided for technique, amplitude, posture  Discussed each PWR! Move and it's relation to functional mobility activities      PT Education - 04/05/19 1013    Education Details  PWR! Moves-benefits, education on high intensity, amplitude of movements and relation to function; initiated PWR! Moves in standing as HEP    Person(s) Educated  Patient    Methods  Explanation;Demonstration;Handout;Verbal cues    Comprehension  Verbalized understanding;Returned demonstration;Verbal cues required          PT Long Term Goals - 04/05/19 1157      PT LONG TERM GOAL #1   Title  Pt will be independent with HEP for improved balance,  gait, posture.  TARGET 05/05/2019    Time  4    Period  Weeks    Status  On-going    Target Date  05/05/19      PT LONG TERM GOAL #2   Title  Pt will perform side step and release push test (to R side) with 2 or less steps to regain balance.    Time  4    Period  Weeks    Status  On-going    Target Date  05/05/19      PT LONG TERM GOAL #3   Title  6 minute walk test to be assessed, with pt to improve by 150 ft, for improved gait endurance and efficiency.    Baseline  1876 ft is normal for community dwelling older adults; 1627 ft 04/05/2019    Time  4    Period  Weeks    Status  On-going    Target Date  05/05/19      PT LONG TERM GOAL #4   Title  Pt will verbalize understanding of local Parkinson's disease-related resources.    Time  4    Period  Weeks    Status  On-going    Target Date  05/05/19      PT LONG TERM GOAL #5   Title  Pt will verbalize plans for appropriate community fitness upon d/c from PT.    Time  4    Period  Weeks    Status  On-going    Target Date  05/05/19            Plan - 04/05/19 1154    Clinical Impression Statement  Pt returns to OPPT clinic today for first visit since March 2020 eval, due to COVID-19 restrictions.  Focused skilled PT session today on instructions for HEP including PWR! (Parkinson Wellness Recovery) Moves in standing, gait and overall instruction in high amplitude, high intensity, deliberate movement patterns.  With cues and instructions, pt is able to improve arm swing with gait and demonstrate good understanding of PWR! Moves in standing.  Initial LTGs remain appropriate and have been extended per renewal for PT completed today.    Personal Factors and Comorbidities  Comorbidity 2;Fitness;Time since onset of injury/illness/exacerbation   newly diagnosed 09/2018 with PD   Comorbidities  allergic rhinitis, hypercholesterolemia    Examination-Activity Limitations  Other   Gait-wife reported shuffling gait     Examination-Participation Restrictions  Church;Yard Work;Community Activity;Other   Golfing   Stability/Clinical Decision Making  Evolving/Moderate complexity    Rehab Potential  Good    PT Frequency  2x / week    PT Duration  4 weeks  per recert 04/05/2019   PT Treatment/Interventions  ADLs/Self Care Home Management;Gait training;Stair training;Functional mobility training;Therapeutic activities;Therapeutic exercise;Patient/family education;Neuromuscular re-education;Balance training    PT Next Visit Plan  Review PWR! Moves in standing, check on walking program and formally issue walking program instructions, aerobic activity and PD exercise chart; gait with arm swing; PD-related resource information    Consulted and Agree with Plan of Care  Patient    Family Member Consulted          Patient will benefit from skilled therapeutic intervention in order to improve the following deficits and impairments:  Abnormal gait, Decreased balance, Difficulty walking, Decreased mobility, Impaired tone, Postural dysfunction  Visit Diagnosis: Other abnormalities of gait and mobility  Abnormal posture  Unsteadiness on feet  Other symptoms and signs involving the nervous system     Problem List There are no active problems to display for this patient.   Xzavien Harada W. 04/05/2019, 12:01 PM  Gean Maidens., PT   [facility] [place] [address] [place], [place], [address] Phone: [phone]   Fax:  [phone]  Name: David Krueger MRN: [id number] Date of Birth: [birth year]

## 2019-04-05 NOTE — Therapy (Signed)
[facility] [address] [place], [place], [address] Phone: [phone]   Fax:  [phone]  Occupational Therapy Treatment  Patient Details  Name: David Krueger MRN: [id number] Date of Birth: 14-Nov-1950 Referring Provider (OT): Dr. Huston FoleySaima Krueger   Encounter Date: 04/05/2019  OT End of Session - 04/05/19 0916    Visit Number  2    Number of Visits  13    Date for OT Re-Evaluation  06/04/19    Authorization Type  [employer] (Medicare guidelines)    Authorization Time Period  cert. 01/10/19-04/11/19 extended due to missed visits due to COVID-19 closure    Authorization - Visit Number  2    Authorization - Number of Visits  10    OT Start Time  0845    OT Stop Time  [phone]    OT Time Calculation (min)  43 min    Activity Tolerance  Patient tolerated treatment well    Behavior During Therapy  [facility] for tasks assessed/performed       Past Medical History:  Diagnosis Date  . Allergic rhinitis   . Hypercholesterolemia     Past Surgical History:  Procedure Laterality Date  . VASECTOMY      There were no vitals filed for this visit.  Subjective Assessment - 04/05/19 1329    Pertinent History  Newly diagnosed PD (11/19).  PMH:  high cholesterol, primary central sleep apnea    Patient Stated Goals  establish HEP, prevent future problems    Currently in Pain?  No/denies                           OT Education/ Treatment - 04/05/19 1331    Education Details  ways to prevent future complications, PWR! seated and quadraped 10-20 reps each min v.c/ demo, coordination HEP, min v.c for larger amplitude movements    Person(s) Educated  Patient    Methods  Explanation;Demonstration;Verbal cues;Handout    Comprehension  Verbalized understanding;Returned demonstration;Verbal cues required       OT Short Term Goals - 01/10/19 2306      OT SHORT TERM GOAL #1   Title  Pt will be independent with  PD-specific HEP.-   Time  4    Period  Weeks    Status  New      OT SHORT TERM GOAL #2   Title  Pt will veralize understanding of ways to decr risk of future complcations related to PD.    Time  4    Period  Weeks    Status  New      OT SHORT TERM GOAL #3   Title  Pt will verbalize understanding of appropriate PD-related community resources.    Time  4    Period  Weeks    Status  New        OT Long Term Goals - 01/10/19 2309      OT LONG TERM GOAL #1   Title  Pt will verbalize understanding of adaptive strategies to incr ease and decr risk of future complications with ADLs/IADLs.    Time  6    Period  Weeks    Status  New      OT LONG TERM GOAL #2   Title  Pt will improve bilateral hand coordination and incr ease with dressing as shown by fastening/unfastening 3 buttons in less than or equal to 38sec.    Baseline  44.65sec    Time  6    Period  Weeks    Status  New      OT LONG TERM GOAL #3   Title  Pt will improve bilateral coordination/functional reaching as shown by improving score on box and blocks test by at least 3 bilaterally.    Baseline  R-49, L-49blocks    Time  6    Period  Weeks    Status  New      OT LONG TERM GOAL #4   Title  Pt will improve dressing ease as shown by improving PPT#4 by at least 3sec.    Baseline  13.56sec    Time  6    Period  Weeks    Status  New      OT LONG TERM GOAL #5   Title  Pt will improve coordination for ADLs as shown by improving score on 9-hole peg test by at least 3sec with L hand.    Baseline  L-30.91sec    Time  6    Period  Weeks    Status  New            Plan - 04/05/19 7353    Clinical Impression Statement  Pt is progressing towards goals. He demonstrates good performance of PWR! moves in seated and quadraped.    Occupational performance deficits (Please refer to evaluation for details):  ADL's;IADL's;Leisure;Social Participation    Body Structure / Function / Physical Skills  ADL;Coordination;GMC;UE  functional use;Balance;Decreased knowledge of use of DME;IADL;Dexterity;FMC;Mobility;ROM;Tone    Rehab Potential  Good    OT Frequency  2x / week    OT Duration  6 weeks    OT Treatment/Interventions  Self-care/ADL training;Therapeutic exercise;Patient/family education;Neuromuscular education;Moist Heat;Functional Development worker, community;Therapeutic activities;Balance training;Cognitive remediation/compensation;Manual Therapy;DME and/or AE instruction;Cryotherapy    Plan  check on HEP(consider review of PWR! quadraped), work towards goals- pt is only scheduled for 2 visits, provide community resources, big movments with ADLs    Consulted and Agree with Plan of Care  Patient       Patient will benefit from skilled therapeutic intervention in order to improve the following deficits and impairments:  Body Structure / Function / Physical Skills, Psychosocial Skills, Cognitive Skills  Visit Diagnosis: Other symptoms and signs involving the nervous system  Other symptoms and signs involving the musculoskeletal system  Other lack of coordination  Abnormal posture    Problem List There are no active problems to display for this patient.   RINE,KATHRYN 04/05/2019, 1:33 PM  [facility] [address] [place], [place], [address] Phone: [phone]   Fax:  [phone]  Name: David Krueger MRN: [id number] Date of Birth: 25-Mar-1951

## 2019-04-05 NOTE — Patient Instructions (Addendum)
   Perform 20 reps, each move, 1 time per day Provided info on PWR! Moves You Tube link for reference on performance of exercises

## 2019-04-05 NOTE — Patient Instructions (Signed)
Coordination Exercises  Perform the following exercises for 20 minutes 1 times per day. Perform with both hand(s). Perform using big movements.   Flipping Cards: Place deck of cards on the table. Flip cards over by opening your hand big to grasp and then turn your palm up big.  Deal cards: Hold 1/2 or whole deck in your hand. Use thumb to push card off top of deck with one big push.  Rotate ball with fingertips: Pick up with fingers/thumb and move as much as you can with each turn/movement (clockwise and counter-clockwise).  Toss ball from one hand to the other: Toss big/high.  Toss ball in the air and catch with the same hand: Toss big/high.  Pick up coins and stack one at a time: Pick up with big, intentional movements. Do not drag coin to the edge. (5-10 in a stack)  Pick up 5-10 coins one at a time and hold in palm. Then, move coins from palm to fingertips one at time and place in coin bank/container.  Practice writing: Slow down, write big, and focus on forming each letter.  Practice typing.  Perform "Flicks"/hand stretches (PWR! Hands): Close hands then flick out your fingers with focus on opening hands, pulling wrists back, and extending elbows like you are pushing.      Ways to prevent future Parkinson's related complications: 1.   Exercise regularly,  2.   Focus on BIGGER movements during daily activities- really reach overhead, straighten elbows and extend fingers 3.   When dressing or reaching for your seatbelt make sure to use your body to assist by twisting while you reach-this can help to minimize stress on the shoulder and reduce the risk of a rotator cuff tear 4.   Swing your arms when you walk! People with PD are at increased risk for frozen shoulder and swinging your arms can reduce this risk.

## 2019-04-13 ENCOUNTER — Ambulatory Visit: Payer: PPO | Admitting: Occupational Therapy

## 2019-04-13 ENCOUNTER — Ambulatory Visit: Payer: PPO | Attending: Neurology | Admitting: Physical Therapy

## 2019-04-13 ENCOUNTER — Other Ambulatory Visit: Payer: Self-pay

## 2019-04-13 ENCOUNTER — Encounter: Payer: Self-pay | Admitting: Occupational Therapy

## 2019-04-13 ENCOUNTER — Encounter: Payer: Self-pay | Admitting: Physical Therapy

## 2019-04-13 DIAGNOSIS — R251 Tremor, unspecified: Secondary | ICD-10-CM

## 2019-04-13 DIAGNOSIS — R2689 Other abnormalities of gait and mobility: Secondary | ICD-10-CM

## 2019-04-13 DIAGNOSIS — R2681 Unsteadiness on feet: Secondary | ICD-10-CM | POA: Diagnosis not present

## 2019-04-13 DIAGNOSIS — R293 Abnormal posture: Secondary | ICD-10-CM | POA: Insufficient documentation

## 2019-04-13 DIAGNOSIS — R278 Other lack of coordination: Secondary | ICD-10-CM | POA: Insufficient documentation

## 2019-04-13 DIAGNOSIS — R29818 Other symptoms and signs involving the nervous system: Secondary | ICD-10-CM | POA: Diagnosis not present

## 2019-04-13 DIAGNOSIS — R29898 Other symptoms and signs involving the musculoskeletal system: Secondary | ICD-10-CM | POA: Insufficient documentation

## 2019-04-13 NOTE — Patient Instructions (Signed)
   Performing Daily Activities with Big Movements  Pick at least 2 activities a day and perform with BIG, DELIBERATE movements/effort.  Try different activities each day. This can make the activity easier and turn daily activities into exercise to prevent problems in the future!  If you are standing during the activity, make sure to keep feet apart and stand with good/big/PWR! UP posture.  Examples:  Dressing - Push arms in sleeves, twist when putting on jacket, push foot into pants, open hands to pull down shirt/put on socks/pull up pants  Buttoning - Open hands big (PWR! Hands) before fastening each button  Bathing - Wash/dry with long strokes  Brushing your teeth - Big, slow movements  Cutting food - Long deliberate cuts  Eating - Hold utensil in the middle, not the end  Picking up a cup/bottle - Open hand up big and get object all the way in palm  Opening jar/bottle - Move as much as you can with each turn  Putting on seatbelt - Twist when reaching  Hanging up clothes/getting clothes down from closet - Reach with big effort  Putting away groceries/dishes - Reach with big effort  Wiping counter/table - Move in big, long strokes  Stirring while cooking - Exaggerate movement  Cleaning windows - Move in big, long strokes  Sweeping - Move arms in big, long strokes  Vacuuming - Push with big movement  Folding clothes - Exaggerate arm movements  Washing car - Move in big, long strokes  Raking - Move arms in big, long strokes  Changing light bulb - Move as much as you can with each turn  Using a screwdriver - Move as much as you can with each turn  Walking into a store/restaurant - Walk with big steps, swing arms if able  Standing up from a chair/recliner/sofa - Scoot forward, lean forward, and stand with big effort  

## 2019-04-13 NOTE — Therapy (Signed)
[facility] [address] [place], [place], [address] Phone: [phone]   Fax:  [phone]  Physical Therapy Treatment  Patient Details  Name: David Krueger MRN: [id number] Date of Birth: Dec 02, 1950 Referring Provider (PT): Athar   Encounter Date: 04/13/2019  CLINIC OPERATION CHANGES: Outpatient Neuro Rehab is open at lower capacity following universal masking, social distancing, and patient screening.  The patient's COVID risk of complications score is 2.   PT End of Session - 04/13/19 1058    Visit Number  3    Number of Visits  9    Date for PT Re-Evaluation  06/04/19   per renewal 04/05/2019   Authorization Type  [employer]-Will need 10th visit progress note    PT Start Time  0952    PT Stop Time  1040    PT Time Calculation (min)  48 min    Activity Tolerance  Patient tolerated treatment well    Behavior During Therapy  [facility] for tasks assessed/performed       Past Medical History:  Diagnosis Date  . Allergic rhinitis   . Hypercholesterolemia     Past Surgical History:  Procedure Laterality Date  . VASECTOMY      There were no vitals filed for this visit.  Subjective Assessment - 04/13/19 0954    Subjective  I have been excited about these exercises.  They make me think of yoga.    Patient is accompained by:  Family member   wife   Patient Stated Goals  Wife:  gait and stride length improve and to stop dragging feet; pt:  to slow progression of disease    Currently in Pain?  No/denies         Neuro Re-education: -Reviewed HEP given last week:  Pt performs PWR! Moves in standing position x 20 reps   PWR! Up for improved posture  PWR! Rock for improved weighshifting-cues for widened BOS and correct reaching technique (performed 2nd set of PWR! Rock at [employer])  PWR! Twist for improved trunk rotation-cues for increased trunk rotation, pivot foot for improved mobility    PWR! Step for improved step initiation-side direction   Verbal and visual Cues provided for intensity, initial reminder of technique  Performed PWR! Moves FLOW in standing (PWR! Up>PWR! Rock>PWR! Twist>PWR! Step) x 3 reps for increased complexity and dual tasking                 [facility] Adult PT Treatment/Exercise - 04/13/19 0001      Ambulation/Gait   Ambulation/Gait  Yes    Ambulation/Gait Assistance  7: Independent    Ambulation Distance (Feet)  575 Feet   then 800 ft outdoor, on hills, inclines   Assistive device  None    Gait Pattern  Step-through pattern;Decreased arm swing - left;Decreased step length - left;Decreased trunk rotation;Decreased arm swing - right    Ambulation Surface  Level;Indoor    Pre-Gait Activities  In clinic, utilized bilateral walking poles to faciliate arm swing.  Following facilitation with walking poles, pt demonstrates good carryover for arm swing on outdoor surfaces    Gait Comments  Cues for increased effort for upright posture, arm swing, step length.  On inclines and declines on outdoor surfaces, PT provides cues for foot clearance, heelstrike, posture.      Exercises   Exercises  Knee/Hip      Knee/Hip Exercises: Aerobic   Stepper  SciFit Seated Stepper, Level 1.5, lower extremities only (  start at 55 RPM, increase to 100 RPM) to work on intensity of aerobic activity, x 7 minutes (Pt rates effort level 5-6/10).  Discussed aerobic activity as part of overall PD fitness program at home (using his [employer] bike at home).            Balance Exercises - 04/13/19 1001      Balance Exercises: Standing   Stepping Strategy  Anterior;Posterior;10 reps   Cues for big effort, coordinated arm movement       PT Education - 04/13/19 1056    Education Details  Optimal fitness program for people with PD, including increased intensity of effort with walking, aerobic activity and daily performance of PWR! Moves; added standing forward  and back step strategy    Person(s) Educated  Patient    Methods  Explanation;Demonstration;Verbal cues;Handout    Comprehension  Verbalized understanding;Returned demonstration;Verbal cues required          PT Long Term Goals - 04/05/19 1157      PT LONG TERM GOAL #1   Title  Pt will be independent with HEP for improved balance, gait, posture.  TARGET 05/05/2019    Time  4    Period  Weeks    Status  On-going    Target Date  05/05/19      PT LONG TERM GOAL #2   Title  Pt will perform side step and release push test (to R side) with 2 or less steps to regain balance.    Time  4    Period  Weeks    Status  On-going    Target Date  05/05/19      PT LONG TERM GOAL #3   Title  6 minute walk test to be assessed, with pt to improve by 150 ft, for improved gait endurance and efficiency.    Baseline  1876 ft is normal for community dwelling older adults; 1627 ft 04/05/2019    Time  4    Period  Weeks    Status  On-going    Target Date  05/05/19      PT LONG TERM GOAL #4   Title  Pt will verbalize understanding of local Parkinson's disease-related resources.    Time  4    Period  Weeks    Status  On-going    Target Date  05/05/19      PT LONG TERM GOAL #5   Title  Pt will verbalize plans for appropriate community fitness upon d/c from PT.    Time  4    Period  Weeks    Status  On-going    Target Date  05/05/19            Plan - 04/13/19 1058    Clinical Impression Statement  Skilled PT session today focused on standing PWR! Moves exercises review and additions for forward/back step strategy exercises.  Pt returns demo understanding, but needs cues for increased effort and correct technique.  Education provided on intensity and amplitude of aerobic activity, walking, and PWR! Moves, with pt responding well to cues.  Pt will continue to benefit from skilled PT to address PD-specific exercise routine for optimal mobility.    Personal Factors and Comorbidities  Comorbidity  2;Fitness;Time since onset of injury/illness/exacerbation   newly diagnosed 09/2018 with PD   Comorbidities  allergic rhinitis, hypercholesterolemia    Examination-Activity Limitations  Other   Gait-wife reported shuffling gait    Examination-Participation Restrictions  Church;Yard Work;Community Activity;Other  Golfing   Stability/Clinical Decision Making  Evolving/Moderate complexity    Rehab Potential  Good    PT Frequency  2x / week    PT Duration  4 weeks   per recert 04/05/2019   PT Treatment/Interventions  ADLs/Self Care Home Management;Gait training;Stair training;Functional mobility training;Therapeutic activities;Therapeutic exercise;Patient/family education;Neuromuscular re-education;Balance training    PT Next Visit Plan  Review additions to HEP, check on walking program and intensity of aerobic activity; PD-related resource info; add PWR! Moves flow in standing to HEP; discuss d/c    Consulted and Agree with Plan of Care  Patient    Family Member Consulted          Patient will benefit from skilled therapeutic intervention in order to improve the following deficits and impairments:  Abnormal gait, Decreased balance, Difficulty walking, Decreased mobility, Impaired tone, Postural dysfunction  Visit Diagnosis: Other abnormalities of gait and mobility  Abnormal posture     Problem List There are no active problems to display for this patient.   Dawanda Mapel W. 04/13/2019, 11:04 AM  Lonia BloodAmy Lakeyta Vandenheuvel, PT 04/13/19 11:07 AM Phone: [phone] Fax: [phone]    [facility] [address] [place], [place], [address] Phone: [phone]   Fax:  [phone]  Name: David Krueger MRN: [id number] Date of Birth: December 15, 1950

## 2019-04-13 NOTE — Patient Instructions (Addendum)
Optimal Fitness Program after Therapy for People with Parkinson's Disease  1)  Therapy Home Exercise Program  -Do these Exercises DAILY as instructed by your therapist  -Big, deliberate effort with exercises  -These exercises are important to perform consistently, even when therapist has  finished, because these therapy exercises often address your specific  Parkinson's difficulties   2)  Walking  -  Work up to walking 3-5 times per week, 20-30 minutes per day  (Sart at 10 minutes)  -This can be done at home, driveway, quiet street or an indoor track  -Focus should be on your Best posture, arm swing, step length for your best  walking pattern  3)  Aerobic Exercise  -Work up to 3-5 times per week, 30 minutes per day  -This can be stationary bike, seated stepper machine, elliptical machine  -Work up to 6-8/10 intensity during the exercise, at minimal to moderate     Resistance

## 2019-04-13 NOTE — Therapy (Signed)
[facility] [address] [place], [place], [address] Phone: [phone]   Fax:  [phone]  Occupational Therapy Treatment  Patient Details  Name: David Krueger MRN: [id number] Date of Birth: 03-Jan-1951 Referring Provider (OT): Dr. [name]   Encounter Date: 04/13/2019  OT End of Session - 04/13/19 0904    Visit Number  3    Number of Visits  13    Date for OT Re-Evaluation  06/04/19    Authorization Type  [employer] (Medicare guidelines)    Authorization Time Period  cert. 01/10/19-04/11/19 extended due to missed visits due to COVID-19 closure    Authorization - Visit Number  3    Authorization - Number of Visits  10    OT Start Time  0902    OT Stop Time  0947    OT Time Calculation (min)  45 min    Activity Tolerance  Patient tolerated treatment well    Behavior During Therapy  [facility] for tasks assessed/performed       Past Medical History:  Diagnosis Date  . Allergic rhinitis   . Hypercholesterolemia     Past Surgical History:  Procedure Laterality Date  . VASECTOMY      There were no vitals filed for this visit.  Subjective Assessment - 04/13/19 0903    Subjective   Pt reports exercises are going well at home.    Pertinent History  Newly diagnosed PD (11/19).  PMH:  high cholesterol, primary central sleep apnea    Patient Stated Goals  establish HEP, prevent future problems    Currently in Pain?  No/denies           CLINIC OPERATION CHANGES: Outpatient Neuro Rehab is open at lower capacity following universal masking, social distancing, and patient screening.  The patient's COVID risk of complications score is 2.   Pt practiced/return demo the following using large amplitude movement strategies after initial instruction:  Donning/doffing jacket, fastening/unfastening buttons, holding spoon for eating, grasp bottle.  Pt responded well to cueing for large amplitude movement  strategies.         OT Education - 04/13/19 8563    Education Details  Large amplitude movement strategies for ADLs.  Reviewed ways to decr risk of future complications related to PD.  Added rotating 2 golf balls in hand to coordination HEP.    Person(s) Educated  Patient    Methods  Explanation;Demonstration;Verbal cues;Handout    Comprehension  Verbalized understanding;Returned demonstration;Verbal cues required       OT Short Term Goals - 04/13/19 1239      OT SHORT TERM GOAL #1   Title  Pt will be independent with PD-specific HEP.--check STGs 02/10/16    Time  4    Period  Weeks    Status  New      OT SHORT TERM GOAL #2   Title  Pt will veralize understanding of ways to decr risk of future complcations related to PD.    Time  4    Period  Weeks    Status  Achieved   04/13/19     OT SHORT TERM GOAL #3   Title  Pt will verbalize understanding of appropriate PD-related community resources.    Time  4    Period  Weeks    Status  New        OT Long Term Goals - 01/10/19 2309      OT LONG TERM GOAL #1  Title  Pt will verbalize understanding of adaptive strategies to incr ease and decr risk of future complications with ADLs/IADLs.    Time  6    Period  Weeks    Status  New      OT LONG TERM GOAL #2   Title  Pt will improve bilateral hand coordination and incr ease with dressing as shown by fastening/unfastening 3 buttons in less than or equal to 38sec.    Baseline  44.65sec    Time  6    Period  Weeks    Status  New      OT LONG TERM GOAL #3   Title  Pt will improve bilateral coordination/functional reaching as shown by improving score on box and blocks test by at least 3 bilaterally.    Baseline  R-49, L-49blocks    Time  6    Period  Weeks    Status  New      OT LONG TERM GOAL #4   Title  Pt will improve dressing ease as shown by improving PPT#4 by at least 3sec.    Baseline  13.56sec    Time  6    Period  Weeks    Status  New      OT LONG TERM GOAL  #5   Title  Pt will improve coordination for ADLs as shown by improving score on 9-hole peg test by at least 3sec with L hand.    Baseline  L-30.91sec    Time  6    Period  Weeks    Status  New            Plan - 04/13/19 0904    Clinical Impression Statement  Pt is progressing towards goals.  He demo improved movement patterns and incr ease with functional tasks with large amplitude movement strategies.    Occupational performance deficits (Please refer to evaluation for details):  ADL's;IADL's;Leisure;Social Participation    Body Structure / Function / Physical Skills  ADL;Coordination;GMC;UE functional use;Balance;Decreased knowledge of use of DME;IADL;Dexterity;FMC;Mobility;ROM;Tone    Rehab Potential  Good    OT Frequency  2x / week    OT Duration  6 weeks    OT Treatment/Interventions  Self-care/ADL training;Therapeutic exercise;Patient/family education;Neuromuscular education;Moist Heat;Functional Development worker, communityMobility Training;Therapeutic activities;Balance training;Cognitive remediation/compensation;Manual Therapy;DME and/or AE instruction;Cryotherapy    Plan  consider review of PWR! quadraped/check on HEP, complete education, writing, check remaining goals and anticipate d/c next visit    Consulted and Agree with Plan of Care  Patient       Patient will benefit from skilled therapeutic intervention in order to improve the following deficits and impairments:  Body Structure / Function / Physical Skills, Psychosocial Skills, Cognitive Skills  Visit Diagnosis: Other symptoms and signs involving the nervous system  Other symptoms and signs involving the musculoskeletal system  Other lack of coordination  Unsteadiness on feet  Abnormal posture  Other abnormalities of gait and mobility  Tremor    Problem List There are no active problems to display for this patient.   [facility] Corp[facility] ChristiFREEMAN,David Jakubiak 04/13/2019, 12:43 PM  [facility] [address] [place], [place], [address] Phone: [phone]   Fax:  [phone]  Name: Thomasene MohairJohn W Krueger MRN: [id number] Date of Birth: 11-01-1951   Willa FraterAngela Idalee Foxworthy, OTR/L [facility] [address] [place], [place]  [address] [phone] phone [phone] 04/13/19 12:43 PM

## 2019-04-20 ENCOUNTER — Ambulatory Visit: Payer: PPO | Admitting: Occupational Therapy

## 2019-04-20 ENCOUNTER — Encounter: Payer: Self-pay | Admitting: Physical Therapy

## 2019-04-20 ENCOUNTER — Ambulatory Visit: Payer: PPO | Admitting: Physical Therapy

## 2019-04-20 ENCOUNTER — Encounter: Payer: Self-pay | Admitting: Occupational Therapy

## 2019-04-20 ENCOUNTER — Other Ambulatory Visit: Payer: Self-pay

## 2019-04-20 DIAGNOSIS — R2689 Other abnormalities of gait and mobility: Secondary | ICD-10-CM | POA: Diagnosis not present

## 2019-04-20 DIAGNOSIS — R278 Other lack of coordination: Secondary | ICD-10-CM

## 2019-04-20 DIAGNOSIS — R293 Abnormal posture: Secondary | ICD-10-CM

## 2019-04-20 DIAGNOSIS — R2681 Unsteadiness on feet: Secondary | ICD-10-CM

## 2019-04-20 DIAGNOSIS — R29898 Other symptoms and signs involving the musculoskeletal system: Secondary | ICD-10-CM

## 2019-04-20 DIAGNOSIS — R29818 Other symptoms and signs involving the nervous system: Secondary | ICD-10-CM

## 2019-04-20 DIAGNOSIS — R251 Tremor, unspecified: Secondary | ICD-10-CM

## 2019-04-20 NOTE — Therapy (Signed)
[place] [address] [place], [place], [address] Phone: [phone]   Fax:  [phone]  Physical Therapy Treatment  Patient Details  Name: David Krueger MRN: [id number] Date of Birth: 11/29/1950 Referring Provider (PT): Athar   Encounter Date: 04/20/2019  CLINIC OPERATION CHANGES: Outpatient Neuro Rehab is open at lower capacity following universal masking, social distancing, and patient screening.  The patient's COVID risk of complications score is 2.   PT End of Session - 04/20/19 0952    Visit Number  4    Number of Visits  9    Date for PT Re-Evaluation  06/04/19   per renewal 04/05/2019   Authorization Type  [employer]-Will need 10th visit progress note    PT Start Time  0901    PT Stop Time  0945    PT Time Calculation (min)  44 min    Activity Tolerance  Patient tolerated treatment well    Behavior During Therapy  [facility] for tasks assessed/performed       Past Medical History:  Diagnosis Date  . Allergic rhinitis   . Hypercholesterolemia     Past Surgical History:  Procedure Laterality Date  . VASECTOMY      There were no vitals filed for this visit.  Subjective Assessment - 04/20/19 0903    Subjective  Have been doing the exercises-no difficulties, no changes since last visit    Patient is accompained by:  Family member   wife   Patient Stated Goals  Wife:  gait and stride length improve and to stop dragging feet; pt:  to slow progression of disease    Currently in Pain?  No/denies         [facility] PT Assessment - 04/20/19 0001      6 minute walk test results    Aerobic Endurance Distance Walked  1679             Performed PWR! Moves FLOW in standing (PWR! Up>PWR! Rock>PWR! Twist>PWR! Step) x 5 reps for increased complexity and dual tasking.        [facility] Adult PT Treatment/Exercise - 04/20/19 0001      Ambulation/Gait   Ambulation/Gait  Yes    Ambulation/Gait  Assistance  7: Independent    Ambulation Distance (Feet)  1720 Feet   1679 ft in 6MWT   Assistive device  None    Gait Pattern  Step-through pattern;Decreased step length - left;Decreased trunk rotation    Ambulation Surface  Level;Indoor      Self-Care   Self-Care  Other Self-Care Comments    Other Self-Care Comments   Reviewed information from last visit on aerobic activity at home ([employer] bike) and walking program.  Pt reports increased attention to posture, step length with gait around yard, but has not fully started walking program.  Educated patient on progress towards goals, ongoing/continued HEP and exercise program, and local/online Parkinson's disease resources.          Balance Exercises - 04/20/19 0950      Balance Exercises: Standing   Stepping Strategy  Anterior;Posterior;10 reps   Review of HEP-pt return demo understanding   Other Standing Exercises  Multi-directional stepping-forward, back, side varied directions       Discussed ways to progress with use of standing PWR! Moves Flow, multi-directional stepping variations for added cognitive/physical challenge  PT Education - 04/20/19 0951    Education Details  Progress towards goals, POC and plans for d/c; PWR! Moves flow  and multi-directional stepping; Parkinson's local/online resources    Person(s) Educated  Patient    Methods  Explanation;Handout    Comprehension  Verbalized understanding;Returned demonstration          PT Long Term Goals - 04/20/19 0921      PT LONG TERM GOAL #1   Title  Pt will be independent with HEP for improved balance, gait, posture.  TARGET 05/05/2019    Time  4    Period  Weeks    Status  Achieved      PT LONG TERM GOAL #2   Title  Pt will perform side step and release push test (to R side) with 2 or less steps to regain balance.    Time  4    Period  Weeks    Status  Achieved      PT LONG TERM GOAL #3   Title  6 minute walk test to be assessed, with pt to improve by 150  ft, for improved gait endurance and efficiency.    Baseline  1876 ft is normal for community dwelling older adults; 1627 ft 04/05/2019; 1769 ft in 6MWT 04/20/2019    Time  4    Period  Weeks    Status  Partially Met      PT LONG TERM GOAL #4   Title  Pt will verbalize understanding of local Parkinson's disease-related resources.    Time  4    Period  Weeks    Status  Achieved      PT LONG TERM GOAL #5   Title  Pt will verbalize plans for appropriate community fitness upon d/c from PT.    Time  4    Period  Weeks    Status  Achieved            Plan - 04/20/19 7939    Clinical Impression Statement  Assessed LTGs this visit, with pt meeting LTG 1, 2, 4.  LTG 3 partially met, with pt improving 6MWT by 142 ft.  Pt has made overall good progress, with pt demonstrating improved gait pattern, posture, deliberate movement with exercises.  He is appropriate for d/c from PT this visit.    Personal Factors and Comorbidities  Comorbidity 2;Fitness;Time since onset of injury/illness/exacerbation   newly diagnosed 09/2018 with PD   Comorbidities  allergic rhinitis, hypercholesterolemia    Examination-Activity Limitations  Other   Gait-wife reported shuffling gait    Examination-Participation Restrictions  Church;Yard Work;Community Activity;Other   Golfing   Stability/Clinical Decision Making  Evolving/Moderate complexity    Rehab Potential  Good    PT Frequency  2x / week    PT Duration  4 weeks   per recert 0/30/0923   PT Treatment/Interventions  ADLs/Self Care Home Management;Gait training;Stair training;Functional mobility training;Therapeutic activities;Therapeutic exercise;Patient/family education;Neuromuscular re-education;Balance training    PT Next Visit Plan  D/C PT this visit; plan for return screens in 6-9 months.    Consulted and Agree with Plan of Care  Patient    Family Member Consulted          Patient will benefit from skilled therapeutic intervention in order to improve  the following deficits and impairments:  Abnormal gait, Decreased balance, Difficulty walking, Decreased mobility, Impaired tone, Postural dysfunction  Visit Diagnosis: Other abnormalities of gait and mobility  Abnormal posture     Problem List There are no active problems to display for this patient.   Aeris Hersman W. 04/20/2019, 9:55 AM Frazier Butt., PT  [place]  [facility] [address] [place], [place], [address] Phone: [phone]   Fax:  [phone]  Name: David Krueger MRN: [id number] Date of Birth: 25-Dec-1950   PHYSICAL THERAPY DISCHARGE SUMMARY  Visits from Start of Care: 4  Current functional level related to goals / functional outcomes: PT Long Term Goals - 04/20/19 0921      PT LONG TERM GOAL #1   Title  Pt will be independent with HEP for improved balance, gait, posture.  TARGET 05/05/2019    Time  4    Period  Weeks    Status  Achieved      PT LONG TERM GOAL #2   Title  Pt will perform side step and release push test (to R side) with 2 or less steps to regain balance.    Time  4    Period  Weeks    Status  Achieved      PT LONG TERM GOAL #3   Title  6 minute walk test to be assessed, with pt to improve by 150 ft, for improved gait endurance and efficiency.    Baseline  1876 ft is normal for community dwelling older adults; 1627 ft 04/05/2019; 1769 ft in 6MWT 04/20/2019    Time  4    Period  Weeks    Status  Partially Met      PT LONG TERM GOAL #4   Title  Pt will verbalize understanding of local Parkinson's disease-related resources.    Time  4    Period  Weeks    Status  Achieved      PT LONG TERM GOAL #5   Title  Pt will verbalize plans for appropriate community fitness upon d/c from PT.    Time  4    Period  Weeks    Status  Achieved         Remaining deficits: Posture, high level balance   Education / Equipment: Educated in [employer], progression of HEP and overall ongoing fitness  program, Parkinson's resources  Plan: Patient agrees to discharge.  Patient goals were met. Patient is being discharged due to meeting the stated rehab goals.  ?????Recommend return PD screens in 6-9 months due to progressive nature of disease.         Mady Haagensen, PT 04/20/19 9:59 AM Phone: [phone] Fax: [phone]

## 2019-04-20 NOTE — Therapy (Signed)
[place] [address] [place], [place], [address] Phone: [phone]   Fax:  [phone]  Occupational Therapy Treatment  Patient Details  Name: David Krueger MRN: [id number] Date of Birth: [birth year] Referring Provider (OT): Dr. Star Age   Encounter Date: 04/20/2019  OT End of Session - 04/20/19 0934    Visit Number  4    Number of Visits  13    Date for OT Re-Evaluation  06/04/19    Authorization Type  [employer] (Medicare guidelines)    Authorization Time Period  cert. 01/10/19-04/11/19 extended due to missed visits due to COVID-19 closure    Authorization - Visit Number  4    Authorization - Number of Visits  10    OT Start Time  1000    OT Stop Time  1050    OT Time Calculation (min)  50 min    Activity Tolerance  Patient tolerated treatment well    Behavior During Therapy  [facility] for tasks assessed/performed       Past Medical History:  Diagnosis Date  . Allergic rhinitis   . Hypercholesterolemia     Past Surgical History:  Procedure Laterality Date  . VASECTOMY      There were no vitals filed for this visit.  Subjective Assessment - 04/20/19 0934    Subjective   I've tried to make a conscious effort to use bigger movements    Pertinent History  Newly diagnosed PD (11/19).  PMH:  high cholesterol, primary central sleep apnea    Patient Stated Goals  establish HEP, prevent future problems    Currently in Pain?  No/denies         CLINIC OPERATION CHANGES: Outpatient Neuro Rehab is open at lower capacity following universal masking, social distancing, and patient screening.  The patient's COVID risk of complications score is 2.   PWR! Moves (basic 4) in quadruped x 20 each with occasional min cues For incr movement amplitude.  PWR! Moves (basic 4) in sitting x 5-10 each with min cues For incr movement amplitude/technique.  Reviewed coordination HEP (ball, card activities) with min v.c. for  incr movement amplitude.    Reviewed follow-up/screen process and indication for return for OT in the future.  Pt verbalized understanding.  Checked goals and discussed progress--see below.          OT Short Term Goals - 04/20/19 1008      OT SHORT TERM GOAL #1   Title  Pt will be independent with PD-specific HEP.--check STGs 02/10/16    Time  4    Period  Weeks    Status  Achieved      OT SHORT TERM GOAL #2   Title  Pt will veralize understanding of ways to decr risk of future complcations related to PD.    Time  4    Period  Weeks    Status  Achieved   04/13/19     OT SHORT TERM GOAL #3   Title  Pt will verbalize understanding of appropriate PD-related community resources.    Time  4    Period  Weeks    Status  Achieved   isssued by PT       OT Long Term Goals - 04/20/19 1034      OT LONG TERM GOAL #1   Title  Pt will verbalize understanding of adaptive strategies to incr ease and decr risk of future complications with ADLs/IADLs.    Time  6    Period  Weeks    Status  Achieved      OT LONG TERM GOAL #2   Title  Pt will improve bilateral hand coordination and incr ease with dressing as shown by fastening/unfastening 3 buttons in less than or equal to 38sec.    Baseline  44.65sec    Time  6    Period  Weeks    Status  Achieved   04/20/19:  27.41     OT LONG TERM GOAL #3   Title  Pt will improve bilateral coordination/functional reaching as shown by improving score on box and blocks test by at least 3 bilaterally.    Baseline  R-49, L-49blocks    Time  6    Period  Weeks    Status  Partially Met   04/20/19:  R-52blocks (met), L-46blocks (not met)     OT LONG TERM GOAL #4   Title  Pt will improve dressing ease as shown by improving PPT#4 by at least 3sec.    Baseline  13.56sec    Time  6    Period  Weeks    Status  Achieved   04/20/19:  7.50sec     OT LONG TERM GOAL #5   Title  Pt will improve coordination for ADLs as shown by improving score on 9-hole  peg test by at least 3sec with L hand.    Baseline  L-30.91sec    Time  6    Period  Weeks    Status  Not Met   04/20/19:  R-30.44sec, L-33.94sec           Plan - 04/20/19 0934    Clinical Impression Statement  Pt has made good progress and demo good understanding of education provided.    Occupational performance deficits (Please refer to evaluation for details):  ADL's;IADL's;Leisure;Social Participation    Body Structure / Function / Physical Skills  ADL;Coordination;GMC;UE functional use;Balance;Decreased knowledge of use of DME;IADL;Dexterity;FMC;Mobility;ROM;Tone    Rehab Potential  Good    OT Frequency  2x / week    OT Duration  6 weeks    OT Treatment/Interventions  Self-care/ADL training;Therapeutic exercise;Patient/family education;Neuromuscular education;Moist Heat;Functional Furniture conservator/restorer;Therapeutic activities;Balance training;Cognitive remediation/compensation;Manual Therapy;DME and/or AE instruction;Cryotherapy    Plan  d/c OT, schedule screen in approx 6-9 months    Consulted and Agree with Plan of Care  Patient       Patient will benefit from skilled therapeutic intervention in order to improve the following deficits and impairments:  Body Structure / Function / Physical Skills, Psychosocial Skills, Cognitive Skills  Visit Diagnosis: Other symptoms and signs involving the musculoskeletal system - Plan:   Other symptoms and signs involving the nervous system - Plan:  Other lack of coordination - Plan:   Unsteadiness on feet - Plan:   Abnormal posture - Plan:   Other abnormalities of gait and mobility - Plan:  Tremor - Plan:     Problem List There are no active problems to display for this patient.    OCCUPATIONAL THERAPY DISCHARGE SUMMARY  Visits from Start of Care: 4  Current functional level related to goals / functional outcomes: See above   Remaining deficits: Bradykinesia, rigidity, decr coordination, abnormal posture, decr  balance/functional mobility, tremors    Education / Equipment: Pt was instructed in the following:  PD-specific HEP, adaptive strategies for ADLs/IADLs, ways to prevent future complications, appropriate community resources.  Pt verbalized understanding of all education provided.    Plan: Patient agrees  to discharge.  Patient goals were partially met. Patient is being discharged due to being pleased with the current functional level.  Pt would benefit from re-evaluation/occupational therapy screen in approx 6-9 months to assess for need for further therapy/functional changes due to progressive nature of diagnosis. ?????         [facility] 04/20/2019, 11:09 AM  [place] [address] [place], [place], [address] Phone: [phone]   Fax:  [phone]  Name: DAYLON LAFAVOR MRN: [id number] Date of Birth: 03-07-1951   Vianne Bulls, OTR/L [facility] [address] [place], [place]  [address] [phone] phone [phone] 04/20/19 11:09 AM

## 2019-04-20 NOTE — Patient Instructions (Signed)
PWR! Moves FLOW in standing:  PWR! Up>PWR! Rock>PWR! Twist>PWR! Step  Varied direction stepping sheet provided  Try to do these about 1-2 times per week to challenge yourself!

## 2019-05-03 DIAGNOSIS — G4731 Primary central sleep apnea: Secondary | ICD-10-CM | POA: Diagnosis not present

## 2019-06-02 DIAGNOSIS — G4731 Primary central sleep apnea: Secondary | ICD-10-CM | POA: Diagnosis not present

## 2019-06-09 DIAGNOSIS — E785 Hyperlipidemia, unspecified: Secondary | ICD-10-CM | POA: Diagnosis not present

## 2019-06-09 DIAGNOSIS — G4731 Primary central sleep apnea: Secondary | ICD-10-CM | POA: Diagnosis not present

## 2019-06-15 ENCOUNTER — Encounter: Payer: Self-pay | Admitting: Neurology

## 2019-06-15 ENCOUNTER — Other Ambulatory Visit: Payer: Self-pay

## 2019-06-15 ENCOUNTER — Ambulatory Visit (INDEPENDENT_AMBULATORY_CARE_PROVIDER_SITE_OTHER): Payer: PPO | Admitting: Neurology

## 2019-06-15 VITALS — BP 124/78 | HR 49 | Ht 68.0 in | Wt 151.0 lb

## 2019-06-15 DIAGNOSIS — G4752 REM sleep behavior disorder: Secondary | ICD-10-CM | POA: Diagnosis not present

## 2019-06-15 DIAGNOSIS — G4731 Primary central sleep apnea: Secondary | ICD-10-CM

## 2019-06-15 DIAGNOSIS — G20 Parkinson's disease: Secondary | ICD-10-CM | POA: Diagnosis not present

## 2019-06-15 NOTE — Progress Notes (Addendum)
Subjective:    Patient ID: David Krueger is a 68 y.o. male.  HPI     Interim history:   David Krueger is a 67 year old right-handed gentleman with an underlying medical history of allergic rhinitis, hyperlipidemia, and anemia, with whom I am contacting a virtual, video based visit via Webex today, for follow-up consultation of his central sleep apnea for which he is on BiPAP, his dream enactment behavior in keeping with RBD and his parkinsonism. The patient is accompanied by his wife today.  I last saw him on 02/09/2019 in virtual visit, at which time he reported Doing well, no dream enactment slightly, speech had improved since starting Requip.  He was tolerating it, was taking 2 mg twice daily.  He was physically active.  He did not have any recent constipation issues.  He was struggling a little bit with the BiPAP but overall was motivated to continue.  His wife felt that the Requip had made a significant difference.  Today, 06/15/2019:    I reviewed his BiPAP ST compliance data from 05/16/2019 through 06/14/2019 which is a total of 30 days, during which time he used his machine 26 nights, with percent used days greater than 4 hours at 73%, indicating adequate compliance with an average usage of 4 hours and 31 minutes, residual AHI at goal at 2.8/h, leak acceptable with a 95th percentile at 12.1 L/min on a pressure of 12/7 with a rate of 14.  He reports the BiPAP is working okay for him.  Some nights are better than others.  It is harder for him to get started in the mornings.  He feels stiffer and slower and sometimes lightheaded.  He has to sit up at the side of the bed for a little bit before he gets moving.  He continues to take low-dose long-acting Requip 2 mg twice daily.  He tolerates it.  His wife has noticed a little bit worsening in his voice especially first thing in the morning he is more raspy.  She feels that the tremor on the left side is a little worse.  He has had therapy and  evaluations through neuro rehab and re-evaluations are pending for January 2021.  They have not been sleeping the same bed for the past few months.  He still has some dream enactment behavior, when they were at the beach recently and were sharing the same bed, she noticed that he moves and twitches in his sleep at least 2 nights out of the 3 night stay where there.  She does not mind the BiPAP or the noise from it.  He has not had any constipation but reports that his appetite is not as great, they eat very healthy but he does not eat very much he admits.  He has had changes in his taste since and sense of smell in the past years.  He is active physically, he likes to work in the yard and also plays golf about 4 times out of the week when possible.  He tries to hydrate well.  Bedtime is around 11.  He takes Requip 2 mg at 7 AM and 7 PM.  He would be willing to increase the dose.  The patient's allergies, current medications, family history, past medical history, past social history, past surgical history and problem list were reviewed and updated as appropriate.    Previously:  I saw him on 12/06/2018, at which time we talked about his recent brain SPECT scan, DaT scan, from 12/01/2018  which showed decreased radiotracer activity within the posterior left and right striata. I suggested we start him on ropinirole with gradual increase and I made a referral to neuro rehabilitation. We also talked about utilizing coenzyme Q10 over-the-counter as a supplement. We also talked about his sleep study results.   He had had a baseline sleep study in December 2019 which showed central sleep apnea, he had a BiPAP titration study in January 2020. His second sleep study showed some changes in keeping with REM behavior disorder.   I reviewed his BiPAP compliance data from 01/03/2019 through 02/01/2019 which is a total of 30 days, during which time he used his BiPAP every night with percent used days greater than 4 hours  at 87%, indicating very good compliance with an average usage of 4 hours and 36 minutes, residual AHI at goal at 1.7 per hour, leak acceptable with the 95th percentile at 7.5 L/m on a pressure of 12/7 with a backup rate of 14.   I first met him on 09/21/2001 at the request of his primary care physician, at which time he reported changes in his gait and posture. He also reported a 3+ year history of vivid dreams and dream enactment behavior. His exam showed evidence of parkinsonism with left-sided lateralization. I suggested further workup for parkinsonism including a DaT scan, and sleep study testing.    He had a nuclear medicine DaT scan on 12/01/2018 and I reviewed the results: IMPRESSION: Decreased radiotracer activity (dopamine transporter populations) within the posterior LEFT and RIGHT striata (putamen) is a pattern suggestive of Parkinson's syndrome pathology.   We called him with his test results.    His baseline sleep study in December 2019 showed moderate primary central sleep apnea. He was advised to return for a BiPAP titration study, which he had in January 2020. During the second sleep study also had some REM related increase in muscle tone and vocalization, in keeping with his history of REM behavior disorder.   09/21/2018: (He) has had some recent changes in his gait and posture. He has had an intermittent tremor, he does not notice his motor changes or posture changes himself but his kids have noticed it and wife has noticed it. He is more concerned about his sleep disturbance. His wife reports that for the past 3+ years he has had intermittent vivid dreams and erratic behaviors and dreams including movements, twitching, and even more dramatic movements such as grabbing her. She sometimes gets scared because of the unpredictable behaviors in his sleep. They still sleep in the same bed together. They have been married 53 years. She has noticed increase in snoring and sometimes apneic  pauses while he is asleep. He sometimes reports vivid dreams to her including fighting with an animal but typically does not have recollection of his dreams. He has a history of Parkinson's disease in paternal grandmother. His own mother lived to be 72 and had trembling or parkinsonism in her 46s. Of note, their son who is currently 5 was diagnosed with essential tremor when he was a child, maybe as young as 10 years old, no significant progression in the tremor and son has not been on any symptomatic treatment for this. Cognitively, patient is fine. He retired last year, he was a Chief Executive Officer. He is very active physically and socially. He likes to golf about 3 days out of the week. They have 3 children, son is the oldest, they have 1 daughter in [place] and one in [place], [place]; she  has 2 kids, ages 60 and 26. I reviewed your office note from 09/09/2018, which you kindly included. His Epworth sleepiness score is 8 out of 24 today, fatigue score is 21 out of 63. He is particularly worried about his sleep disturbance. He is married and lives with his wife, he is a nonsmoker, retired, does not utilize alcohol or caffeine on a regular basis.    His Past Medical History Is Significant For: Past Medical History:  Diagnosis Date  . Allergic rhinitis   . Hypercholesterolemia     His Past Surgical History Is Significant For: Past Surgical History:  Procedure Laterality Date  . VASECTOMY      His Family History Is Significant For: History reviewed. No pertinent family history.  His Social History Is Significant For: Social History   Socioeconomic History  . Marital status: Married    Spouse name: Not on file  . Number of children: Not on file  . Years of education: Not on file  . Highest education level: Not on file  Occupational History  . Not on file  Social Needs  . Financial resource strain: Not on file  . Food insecurity    Worry: Not on file    Inability: Not on file  . Transportation needs     Medical: Not on file    Non-medical: Not on file  Tobacco Use  . Smoking status: Never Smoker  . Smokeless tobacco: Never Used  Substance and Sexual Activity  . Alcohol use: Never    Frequency: Never  . Drug use: Not on file  . Sexual activity: Not on file  Lifestyle  . Physical activity    Days per week: Not on file    Minutes per session: Not on file  . Stress: Not on file  Relationships  . Social Herbalist on phone: Not on file    Gets together: Not on file    Attends religious service: Not on file    Active member of club or organization: Not on file    Attends meetings of clubs or organizations: Not on file    Relationship status: Not on file  Other Topics Concern  . Not on file  Social History Narrative  . Not on file    His Allergies Are:  No Known Allergies:   His Current Medications Are:  Outpatient Encounter Medications as of 06/15/2019  Medication Sig  . Multiple Vitamins-Minerals (ICAPS AREDS 2 PO) Take by mouth.  . Multiple Vitamins-Minerals (MULTIVITAMIN WITH MINERALS) tablet Take 1 tablet by mouth daily.  . niacin 500 MG tablet Take 500 mg by mouth daily.  . Omega-3 Fatty Acids (FISH OIL) 1000 MG CAPS Take by mouth.  Marland Kitchen rOPINIRole (REQUIP XL) 2 MG 24 hr tablet Take 1 tablet (2 mg total) by mouth 2 (two) times daily.  . rosuvastatin (CRESTOR) 10 MG tablet Take 10 mg by mouth at bedtime.   No facility-administered encounter medications on file as of 06/15/2019.   :  Review of Systems:  Out of a complete 14 point review of systems, all are reviewed and negative with the exception of these symptoms as listed below: Review of Systems  Neurological:       Pt presents today to discuss his PD. Pt reports that he is doing ok. Pt reports that his bipap is going well except for the occasional mask leak.    Objective:  Neurological Exam  Physical Exam Physical Examination:   Vitals:  06/15/19 1124  BP: 124/78  Pulse: (!) 49    General  Examination: The patient is a very pleasant 68 y.o. male in no acute distress. He appears well-developed and well-nourished and well groomed.   HEENT:Normocephalic, atraumatic, pupils are equal, round and reactive to light and accommodation. Extraocular tracking is fairly well-preserved, hearing is grossly intact. Face is symmetric, he has mild to moderate facial masking. He has a very slight intermittent lower jaw and lip tremor. Speech is mild to moderately hypophonic.Neckshows mild to moderate nuchal rigidity.  Chest:Clear to auscultation without wheezing, rhonchi or crackles noted.  Heart:S1+S2+0, regular and normal without murmurs, rubs or gallops noted.   Abdomen:Soft, non-tender and non-distended with normal bowel sounds appreciated on auscultation.  Extremities:There isnopitting edema in the distal lower extremities bilaterally.  Skin: Warm and dry without trophic changes noted.  Musculoskeletal: exam reveals no obvious joint deformities, tenderness or joint swelling or erythema.   Neurologically:  Mental status: The patient is awake, alert and oriented in all 4 spheres.Hisimmediate and remote memory, attention, language skills and fund of knowledge are appropriate. There is no evidence of aphasia, agnosia, apraxia or anomia. Speech is clear with normal prosody and enunciation. Thought process is linear. Mood is normaland affect is normal.  Cranial nerves II - XII are as described above under HEENT exam. In addition: shoulder shrug is normal, but left shoulder is slightly higher than right, stable or slightly more.  Motor exam: Normal bulk, strength and tonewith the exception of mild cogwheeling in the left upper extremity, slight cogwheeling in the right upper extremity also. He has an intermittent mild resting tremor in the left hand only, he has no other resting tremor.   (On11/13/2019:on Archimedes spiral drawing he has no significant difficulty with his right  hand which is his dominant hand, he has mild insecurity but no obvious trembling with the left hand, handwriting is legible, not particularly tremulous, but on the smaller side.)  He has no significant postural or action tremor, no intention tremor. On fine motor testing he has minimal difficulty on the right, mild difficulty on the left noted with finger taps and foot taps. Reflexes are1+ throughout.  Cerebellar testing: No dysmetria or intention tremor on finger to nose testing. There is no truncal or gait ataxia.  Sensory exam: intact to light touchin theupper and lower extremities.  Gait, station and balance:Hestands without difficulty and posture is mildly stooped for age, slightly worse, slight lean to the R. He walks with fairly good stride length and pace, but decrease in arm swing on the left. He turns without problems, balance is preserved.  Assessmentand Plan:   In summary,David W Alexanderis a very pleasant 4 year oldmalewith an underlying medical history of allergic rhinitis, hyperlipidemia, and anemia, whopresents for follow-up consultation of his Left-sided predominant Parkinson's disease, central sleep apnea, treated well with BiPAP therapy, and REM behavior disorder. His DaT scan from 12/01/2018 showed findings supportive of parkinsonian syndrome. He has a 4 year history of sleep disturbance including dream enactment behavior. Motor symptoms date back to about 2 years ago, his history and examination are concerning for parkinsonism, likely left-sided predominant Parkinson's disease. His history and recent sleep study results are supportive of REM behavior disorder as well. He was found to have central sleep apnea and has started BiPAP therapy for this in January 2020.  Today I suggested he increase Requip to 6 mg daily, he can either take them altogether once daily in the morning or in the evening as  he is on the long-acting formulation.  He can also take 1 in the morning  and 2 at night for now.  I would like for them to give [place] an update in the next week or 2, at which time we may continue with the 6 mg total dose or we could further increase to 9 mg daily.  He is advised to keep his neuro rehab evaluation appointments in January.  He is advised to make sure he eats enough and enough protein.  He is advised to stay proactive about constipation issues.  He is active physically and mentally.  He is advised to add some strength/resistance training. For his REM behavior disorder, we can consider prescription clonazepam but for now I suggested he try melatonin at night starting with 1 mg strength up to 3 mg or 5 even, no more than 10 mg at night and he is reminded to take it an hour or 2 before his bedtime, therefore around 9 or 10 PM.I suggested a 1-monthfollow-up, sooner if needed, they will be in touch in the interim by phone.  I answered all their questions today and the patient and his wife are in agreement. I spent 30 minutes in total face-to-face time with the patient, more than 50% of which was spent in counseling and coordination of care, reviewing test results, reviewing medication and discussing or reviewing the diagnosis of PD, CSA, RBD, the prognosis and treatment options. Pertinent laboratory and imaging test results that were available during this visit with the patient were reviewed by me and considered in my medical decision making (see chart for details).

## 2019-06-15 NOTE — Patient Instructions (Signed)
You are prescribed a long-acting version of Requip.  Your prescription is up-to-date for 90 days supply at a time.  As discussed, increase to 3 pills a day, you can actually take all 3 pills together if you like.  If you want to break it up in 3 doses it is okay as well.  For now, let us increase your total dose for the day to 6 mg daily.  Give [place] an update by phone in the next week or 2.  We can then decide to go to a total of 9 mg daily or stay at 6 mg daily.  Please add more resistance/strength training to your exercise regimen, eat enough food with enough protein in your diet.  Watch for constipation issues, stay well-hydrated.  Try melatonin at night for sleep: take 1 mg to 3 mg, one to 2 hours before your bedtime. You can go up to 5 mg if needed, I would say no more than 10 mg. It is over the counter and comes in pill form, chewable form and spray, if you prefer.

## 2019-06-29 ENCOUNTER — Telehealth: Payer: Self-pay | Admitting: Neurology

## 2019-06-29 MED ORDER — ROPINIROLE HCL ER 8 MG PO TB24: 8.0000 mg | ORAL_TABLET | Freq: Every day | ORAL | 3 refills | Status: DC

## 2019-06-29 NOTE — Telephone Encounter (Signed)
See other telephone note from 06/29/19.

## 2019-06-29 NOTE — Telephone Encounter (Signed)
I called pt. He reports no change in his tremors since increasing his requip to 6mg . He is wondering if the requip should be increased or maintain 6 mg daily.

## 2019-06-29 NOTE — Telephone Encounter (Signed)
Lets go up to 8 mg strength for the Requip XL generic. Please call him back: I placed a new prescription.  He can certainly use up his current prescription and take 6 mg daily until he finishes his current prescription.  For the new prescription: It is an 8 mg prescription, take 1 pill once daily, typically in the evening.

## 2019-06-29 NOTE — Telephone Encounter (Signed)
I called pt and discussed this with him. He will finish up with his current RX and pick up the new strength later this week. Pt verbalized understanding of recommendations. Pt had no questions at this time but was encouraged to call back if questions arise.

## 2019-06-29 NOTE — Telephone Encounter (Signed)
Pt called wanting to discuss his progress on the rOPINIRole (REQUIP XL) 2 MG 24 hr tablet Please advise.

## 2019-07-03 DIAGNOSIS — G4731 Primary central sleep apnea: Secondary | ICD-10-CM | POA: Diagnosis not present

## 2019-07-11 DIAGNOSIS — G4731 Primary central sleep apnea: Secondary | ICD-10-CM | POA: Diagnosis not present

## 2019-08-03 DIAGNOSIS — G4731 Primary central sleep apnea: Secondary | ICD-10-CM | POA: Diagnosis not present

## 2019-09-02 DIAGNOSIS — G4731 Primary central sleep apnea: Secondary | ICD-10-CM | POA: Diagnosis not present

## 2019-09-09 DIAGNOSIS — E7800 Pure hypercholesterolemia, unspecified: Secondary | ICD-10-CM | POA: Diagnosis not present

## 2019-09-09 DIAGNOSIS — G4731 Primary central sleep apnea: Secondary | ICD-10-CM | POA: Diagnosis not present

## 2019-09-09 DIAGNOSIS — G20 Parkinson's disease: Secondary | ICD-10-CM | POA: Diagnosis not present

## 2019-10-03 DIAGNOSIS — G4731 Primary central sleep apnea: Secondary | ICD-10-CM | POA: Diagnosis not present

## 2019-10-16 ENCOUNTER — Other Ambulatory Visit: Payer: Self-pay

## 2019-10-16 ENCOUNTER — Encounter: Payer: Self-pay | Admitting: Neurology

## 2019-10-16 ENCOUNTER — Ambulatory Visit (INDEPENDENT_AMBULATORY_CARE_PROVIDER_SITE_OTHER): Payer: PPO | Admitting: Neurology

## 2019-10-16 VITALS — BP 117/75 | HR 67 | Ht 68.0 in | Wt 154.0 lb

## 2019-10-16 DIAGNOSIS — G4731 Primary central sleep apnea: Secondary | ICD-10-CM | POA: Diagnosis not present

## 2019-10-16 DIAGNOSIS — G4752 REM sleep behavior disorder: Secondary | ICD-10-CM

## 2019-10-16 DIAGNOSIS — G20 Parkinson's disease: Secondary | ICD-10-CM | POA: Diagnosis not present

## 2019-10-16 NOTE — Patient Instructions (Addendum)
Your exam looks stable, keep up the good work.  You can participate in the rock steady boxing class, I think you will like it. Please be proactive about constipation issues and make sure you have enough nutrition, also try a get healthy snacks in between meals. Your BiPAP usage looks good as well, continue to be consistent with your BiPAP usage, your average usage is 4-1/2 hours, apnea score is good, leak is acceptable on your current pressure settings. You can continue to use melatonin at night for sleep. Continue with Requip once daily long-acting 8 mg strength. Your prescription is up-to-date. Monitor your weight. Follow-up routinely in 6 months.

## 2019-10-16 NOTE — Progress Notes (Signed)
Subjective:    Patient ID: David Krueger is a 68 y.o. male.  HPI     Interim history:  Mr. David Krueger is a 68 year old right-handed gentleman with an underlying medical history of allergic rhinitis, hyperlipidemia, and anemia, who presents for follow-up consultation of his central sleep apnea, on BiPAP therapy, RBD and his Parkinson's disease.  He is accompanied by his wife again today.  I last saw him on 06/15/2019, at which time he was compliant with his BiPAP.  He was taking Requip 2 mg twice daily.  He had noticed changes in his voice.  He still had some dream enactment behavior.  His wife also noted that his left side tremor was a little worse.  I suggested we gradually increase the Requip.   Later in August, we increased his Requip to once daily long-acting 8 mg strength.   Today, 10/16/2019: I reviewed his BiPAP compliance data from 09/17/2019 through 10/16/2019 which is a total of 30 days, during which time he used his machine with percent use days greater than 4 hours at nearly 90%, 87%, indicating excellent compliance with an average usage of 4 hours and 27 minutes, residual AHI at goal at 1.5/h, leak acceptable with the 95th percentile at 14 L/min with a pressure of 12/7 with a backup rate of 14.  He reports doing well, tolerating the Requip.  Sleeping reasonably well and his wife also reports that the melatonin has been helpful, he still moves during his sleep but they were able to sleep in the same bed during their vacation.  At home they are still sleeping in separate bedrooms but could potentially try to sleep in the same bed again.  She also notices the tremor flareup from time to time in the left hand but overall he is doing quite well.  She is also a little bit worried about his weight loss.  He reports having a good appetite, he had a checkup with his primary care physician and has another appointment in February 2021.  They are able to maintain social distancing.  They have not been  around close family even.  He has not had any major health issues thankfully, has been physically quite active and would like to participate in rock steady boxing, brought the form for my signature today which I would be happy to do.  Constipation is under reasonable control.  He has a bowel movement every other day on average, sometimes he gets lightheaded when he stands up too quickly.  He tries to hydrate well.  The patient's allergies, current medications, family history, past medical history, past social history, past surgical history and problem list were reviewed and updated as appropriate.    Previously:      I saw him on 02/09/2019 in virtual visit, at which time he reported Doing well, no dream enactment slightly, speech had improved since starting Requip.  He was tolerating it, was taking 2 mg twice daily.  He was physically active.  He did not have any recent constipation issues.  He was struggling a little bit with the BiPAP but overall was motivated to continue.  His wife felt that the Requip had made a significant difference.   I reviewed his BiPAP ST compliance data from 05/16/2019 through 06/14/2019 which is a total of 30 days, during which time he used his machine 26 nights, with percent used days greater than 4 hours at 73%, indicating adequate compliance with an average usage of 4 hours and 31 minutes,  residual AHI at goal at 2.8/h, leak acceptable with a 95th percentile at 12.1 L/min on a pressure of 12/7 with a rate of 14.     I saw him on 12/06/2018, at which time we talked about his recent brain SPECT scan, DaT scan, from 12/01/2018 which showed decreased radiotracer activity within the posterior left and right striata. I suggested we start him on ropinirole with gradual increase and I made a referral to neuro rehabilitation. We also talked about utilizing coenzyme Q10 over-the-counter as a supplement. We also talked about his sleep study results.   He had had a baseline sleep study  in December 2019 which showed central sleep apnea, he had a BiPAP titration study in January 2020. His second sleep study showed some changes in keeping with REM behavior disorder.   I reviewed his BiPAP compliance data from 01/03/2019 through 02/01/2019 which is a total of 30 days, during which time he used his BiPAP every night with percent used days greater than 4 hours at 87%, indicating very good compliance with an average usage of 4 hours and 36 minutes, residual AHI at goal at 1.7 per hour, leak acceptable with the 95th percentile at 7.5 L/m on a pressure of 12/7 with a backup rate of 14.   I first met him on 09/21/2018 at the request of his primary care physician, at which time he reported changes in his gait and posture. He also reported a 3+ year history of vivid dreams and dream enactment behavior. His exam showed evidence of parkinsonism with left-sided lateralization. I suggested further workup for parkinsonism including a DaT scan, and sleep study testing.    He had a nuclear medicine DaT scan on 12/01/2018 and I reviewed the results: IMPRESSION: Decreased radiotracer activity (dopamine transporter populations) within the posterior LEFT and RIGHT striata (putamen) is a pattern suggestive of Parkinson's syndrome pathology.   We called him with his test results.    His baseline sleep study in December 2019 showed moderate primary central sleep apnea. He was advised to return for a BiPAP titration study, which he had in January 2020. During the second sleep study also had some REM related increase in muscle tone and vocalization, in keeping with his history of REM behavior disorder.   09/21/2018: (He) has had some recent changes in his gait and posture. He has had an intermittent tremor, he does not notice his motor changes or posture changes himself but his kids have noticed it and wife has noticed it. He is more concerned about his sleep disturbance. His wife reports that for the past 3+  years he has had intermittent vivid dreams and erratic behaviors and dreams including movements, twitching, and even more dramatic movements such as grabbing her. She sometimes gets scared because of the unpredictable behaviors in his sleep. They still sleep in the same bed together. They have been married 35 years. She has noticed increase in snoring and sometimes apneic pauses while he is asleep. He sometimes reports vivid dreams to her including fighting with an animal but typically does not have recollection of his dreams. He has a history of Parkinson's disease in paternal grandmother. His own mother lived to be 76 and had trembling or parkinsonism in her 64s. Of note, their son who is currently 61 was diagnosed with essential tremor when he was a child, maybe as young as 42 years old, no significant progression in the tremor and son has not been on any symptomatic treatment for this. Cognitively,  patient is fine. He retired last year, he was a Chief Executive Officer. He is very active physically and socially. He likes to golf about 3 days out of the week. They have 3 children, son is the oldest, they have 1 daughter in [place] and one in [place], [place]; she has 2 kids, ages 63 and 29. I reviewed your office note from 09/09/2018, which you kindly included. His Epworth sleepiness score is 8 out of 24 today, fatigue score is 21 out of 63. He is particularly worried about his sleep disturbance. He is married and lives with his wife, he is a nonsmoker, retired, does not utilize alcohol or caffeine on a regular basis.   His Past Medical History Is Significant For: Past Medical History:  Diagnosis Date  . Allergic rhinitis   . Hypercholesterolemia     His Past Surgical History Is Significant For: Past Surgical History:  Procedure Laterality Date  . VASECTOMY      His Family History Is Significant For: No family history on file.  His Social History Is Significant For: Social History   Socioeconomic History  .  Marital status: Married    Spouse name: Not on file  . Number of children: Not on file  . Years of education: Not on file  . Highest education level: Not on file  Occupational History  . Not on file  Social Needs  . Financial resource strain: Not on file  . Food insecurity    Worry: Not on file    Inability: Not on file  . Transportation needs    Medical: Not on file    Non-medical: Not on file  Tobacco Use  . Smoking status: Never Smoker  . Smokeless tobacco: Never Used  Substance and Sexual Activity  . Alcohol use: Never    Frequency: Never  . Drug use: Not on file  . Sexual activity: Not on file  Lifestyle  . Physical activity    Days per week: Not on file    Minutes per session: Not on file  . Stress: Not on file  Relationships  . Social Herbalist on phone: Not on file    Gets together: Not on file    Attends religious service: Not on file    Active member of club or organization: Not on file    Attends meetings of clubs or organizations: Not on file    Relationship status: Not on file  Other Topics Concern  . Not on file  Social History Narrative  . Not on file    His Allergies Are:  No Known Allergies:   His Current Medications Are:  Outpatient Encounter Medications as of 10/16/2019  Medication Sig  . Melatonin 3 MG TABS Take 3 mg by mouth at bedtime.  . Multiple Vitamins-Minerals (ICAPS AREDS 2 PO) Take by mouth.  . Multiple Vitamins-Minerals (MULTIVITAMIN WITH MINERALS) tablet Take 1 tablet by mouth daily.  . niacin 250 MG tablet Take 250 mg by mouth 2 (two) times daily with a meal.  . Omega-3 Fatty Acids (FISH OIL) 1000 MG CAPS Take by mouth.  Marland Kitchen rOPINIRole (REQUIP XL) 8 MG 24 hr tablet Take 1 tablet (8 mg total) by mouth at bedtime.  . rosuvastatin (CRESTOR) 10 MG tablet Take 10 mg by mouth at bedtime.  . [DISCONTINUED] niacin 500 MG tablet Take 500 mg by mouth daily.   No facility-administered encounter medications on file as of 10/16/2019.    :  Review of Systems:  Out  of a complete 14 point review of systems, all are reviewed and negative with the exception of these symptoms as listed below: Review of Systems  Neurological:       Pt presents today to discuss his PD. Pt feels that he is stable.    Objective:  Neurological Exam  Physical Exam Physical Examination:   Vitals:   10/16/19 0931  BP: 117/75  Pulse: 67    General Examination: The patient is a very pleasant 68 y.o. male in no acute distress. He appears well-developed and well-nourished and well groomed.   HEENT:Normocephalic, atraumatic, pupils are equal, round and reactive to light and accommodation. Extraocular tracking is fairly well-preserved, hearing is grossly intact. Face is symmetric, he has mild to moderate facial masking. He has a very slight intermittent lower jaw and lip tremor. Speech is mild to moderately hypophonic.Neckshows mild to moderate nuchal rigidity.all stable. Tongue protrudes centrally in palate elevates symmetrically.  Chest:Clear to auscultation without wheezing, rhonchi or crackles noted.  Heart:S1+S2+0, regular and normal without murmurs, rubs or gallops noted.   Abdomen:Soft, non-tender and non-distended with normal bowel sounds appreciated on auscultation.  Extremities:There isnopitting edema in the distal lower extremities bilaterally.  Skin: Warm and dry without trophic changes noted.  Musculoskeletal: exam reveals no obvious joint deformities, tenderness or joint swelling or erythema.   Neurologically:  Mental status: The patient is awake, alert and oriented in all 4 spheres.Hisimmediate and remote memory, attention, language skills and fund of knowledge are appropriate. There is no evidence of aphasia, agnosia, apraxia or anomia. Speech is clear with normal prosody and enunciation. Thought process is linear. Mood is normaland affect is normal.  Cranial nerves II - XII are as described above under HEENT  exam. In addition: shoulder shrug is normal, but left shoulder is slightly higher than right, stable. Motor exam: Normal bulk, strength and tonewith the exception of mild cogwheeling in the left upper extremity, slight cogwheeling in the right upper extremity also. He has an intermittentmild resting tremor in the left hand only, he has no other resting tremor.  (On11/13/2019:on Archimedes spiral drawing he has no significant difficulty with his right hand which is his dominant hand, he has mild insecurity but no obvious trembling with the left hand, handwriting is legible, not particularly tremulous, but on the smaller side.)  He has no significant postural or action tremor, no intention tremor. On fine motor testing he hasminimaldifficulty on the right,mild difficulty on the left noted with finger taps and foot taps. Cerebellar testing: No dysmetria or intention tremor on finger to nose testing. There is no truncal or gait ataxia.  Sensory exam: intact to light touchin theupper and lower extremities.  Gait, station and balance:Hestands without difficulty and posture is mildly stooped for age, stable. He walks with fairly good stride length and pace, but decrease in arm swing on the left. He turns without problems, balance ispreserved.  Assessmentand Plan:   In summary,Ottavio W Alexanderis a very pleasant 70 year oldmalewith an underlying medical history of allergic rhinitis, hyperlipidemia, and anemia, whopresents forfollow-up consultation of his Left-sided predominant Parkinson's disease, central sleep apnea, treated well with BiPAP ST therapy, and REM behavior disorder, now on melatonin. His DaT scan from 12/01/2018 showed findings supportive of parkinsonian syndrome. He has a 4 to 5 year history of sleep disturbance including dream enactment behavior. Motor symptoms date back to about 2+ years ago, his history and examination are concerning for parkinsonism, likely left-sided  predominant Parkinson's disease. His history and recent sleep  study results are supportive of REM behavior disorder as well. He was found to have central sleep apnea and has started BiPAP therapy for this in January 2020. He is compliant with his BiPAP ST and commended for it.  His movements at night are a little bit less since he started taking melatonin regularly.  We increased his ropinirole to 6 mg daily In August 2020 and later in August we increase it to once daily long-acting 8 mg strength.  He tolerates it.  His weight is stable but his wife is worried about his overall weight loss.  He is reminded to eat well and also eats snacks in between.  He is advised to be proactive about constipation issues and stay well hydrated, change positions slowly.He has been physically quite active.  He is Interested in participating in rock steady boxing and has his evaluation today.  I signed a support form today. I suggested a 93-monthfollow-up, sooner if needed.  I answered all their questions today and the patient and his wife are in agreement. I spent 25 minutes in total face-to-face time with the patient, more than 50% of which was spent in counseling and coordination of care, reviewing test results, reviewing medication and discussing or reviewing the diagnosis of PD, CSA, RBD, its prognosis and treatment options. Pertinent laboratory and imaging test results that were available during this visit with the patient were reviewed by me and considered in my medical decision making (see chart for details).

## 2019-11-02 DIAGNOSIS — G4731 Primary central sleep apnea: Secondary | ICD-10-CM | POA: Diagnosis not present

## 2019-11-21 DIAGNOSIS — E7800 Pure hypercholesterolemia, unspecified: Secondary | ICD-10-CM | POA: Diagnosis not present

## 2019-11-21 DIAGNOSIS — Z79899 Other long term (current) drug therapy: Secondary | ICD-10-CM | POA: Diagnosis not present

## 2019-11-21 DIAGNOSIS — Z125 Encounter for screening for malignant neoplasm of prostate: Secondary | ICD-10-CM | POA: Diagnosis not present

## 2019-11-23 ENCOUNTER — Ambulatory Visit: Payer: PPO | Admitting: Occupational Therapy

## 2019-11-23 ENCOUNTER — Ambulatory Visit: Payer: PPO | Attending: Neurology | Admitting: Physical Therapy

## 2019-11-23 ENCOUNTER — Other Ambulatory Visit: Payer: Self-pay

## 2019-11-23 ENCOUNTER — Ambulatory Visit: Payer: PPO

## 2019-11-23 DIAGNOSIS — R2689 Other abnormalities of gait and mobility: Secondary | ICD-10-CM | POA: Insufficient documentation

## 2019-11-23 DIAGNOSIS — R278 Other lack of coordination: Secondary | ICD-10-CM

## 2019-11-23 DIAGNOSIS — R471 Dysarthria and anarthria: Secondary | ICD-10-CM | POA: Insufficient documentation

## 2019-11-23 NOTE — Therapy (Signed)
[facility] [address] [place], [place], [address] Phone: [phone]   Fax:  [phone]  Patient Details  Name: David Krueger MRN: [id number] Date of Birth: 1950-12-31 Referring Provider:  Marylen Ponto, MD  Encounter Date: 11/23/2019 Occupational Therapy Parkinson's Disease Screen  Hand dominance:  right  Physical Performance Test item #2 (simulated eating):  11.12 sec  Fastening/unfastening 3 buttons in:  33.81 sec  9-hole peg test:    RUE 30.03 sec        LUE  35.29 sec  Handwriting is legible and good letter size. Pt does not require occupational therapy services at this time.  Recommended occupational therapy screen in   6-8 mons Gedeon Brandow 11/23/2019, 8:21 AM  [facility] [address] [place], [place], [address] Phone: [phone]   Fax:  [phone]

## 2019-11-23 NOTE — Therapy (Signed)
[facility] [address] [place], [place], [address] Phone: [phone]   Fax:  [phone]  Patient Details  Name: David Krueger MRN: [id number] Date of Birth: Dec 14, 1950 Referring Provider:  [name], MD  Encounter Date: 11/23/2019  Physical Therapy Parkinson's Disease Screen   Timed Up and Go test: 8.19 sec  10 meter walk test: 7.43 sec = 4.41 ft/sec  5 time sit to stand test: 9.28 sec    Patient does not require Physical Therapy services at this time.  Recommend Physical Therapy screen in 6-9 months.     Alonso Gapinski W. 11/23/2019, 8:04 AM  Lonia Blood, PT 11/23/19 8:13 AM Phone: [phone] Fax: [phone]    [facility] [address] [place], [place], [address] Phone: [phone]   Fax:  [phone]

## 2019-11-23 NOTE — Therapy (Signed)
[facility] [address] [place], [place], [address] Phone: [phone]   Fax:  [phone]  Patient Details  Name: David Krueger MRN: [id number] Date of Birth: 1951-05-27 Referring Provider:  [name], MD  Encounter Date: 11/23/2019   Speech Therapy Parkinson's Disease Screen   Decibel Level today: mid to upper 60sdB  (WNL=70-72 dB), masked, with sound level meter 30cm away from pt's mouth. Pt's conversational volume has remained essentially the same since last treatment course. Pt reports performing loud /a/ x5/day approx 3-4 times a week.  Pt does not report difficulty in swallowing warranting objective evaluation.  Pt would benefit from speech-language eval for dysarthria - however he declines at this time due to COVID and a desire to see how he fares with doing loud /a/. Because of this I recommend ST screen in another approx 6 months.    [facility] ,MS, CCC-SLP  11/23/2019, 8:38 AM  [facility] [address] [place], [place], [address] Phone: [phone]   Fax:  [phone]

## 2019-11-28 DIAGNOSIS — G20 Parkinson's disease: Secondary | ICD-10-CM | POA: Diagnosis not present

## 2019-11-28 DIAGNOSIS — Z6823 Body mass index (BMI) 23.0-23.9, adult: Secondary | ICD-10-CM | POA: Diagnosis not present

## 2019-11-28 DIAGNOSIS — Z1331 Encounter for screening for depression: Secondary | ICD-10-CM | POA: Diagnosis not present

## 2019-11-28 DIAGNOSIS — Z0000 Encounter for general adult medical examination without abnormal findings: Secondary | ICD-10-CM | POA: Diagnosis not present

## 2019-11-28 DIAGNOSIS — E7800 Pure hypercholesterolemia, unspecified: Secondary | ICD-10-CM | POA: Diagnosis not present

## 2019-12-03 DIAGNOSIS — G4731 Primary central sleep apnea: Secondary | ICD-10-CM | POA: Diagnosis not present

## 2019-12-09 DIAGNOSIS — G20 Parkinson's disease: Secondary | ICD-10-CM | POA: Diagnosis not present

## 2019-12-09 DIAGNOSIS — E7800 Pure hypercholesterolemia, unspecified: Secondary | ICD-10-CM | POA: Diagnosis not present

## 2019-12-09 DIAGNOSIS — D649 Anemia, unspecified: Secondary | ICD-10-CM | POA: Diagnosis not present

## 2020-01-07 DIAGNOSIS — E7800 Pure hypercholesterolemia, unspecified: Secondary | ICD-10-CM | POA: Diagnosis not present

## 2020-01-07 DIAGNOSIS — G20 Parkinson's disease: Secondary | ICD-10-CM | POA: Diagnosis not present

## 2020-01-23 DIAGNOSIS — M436 Torticollis: Secondary | ICD-10-CM | POA: Diagnosis not present

## 2020-01-23 DIAGNOSIS — Z6823 Body mass index (BMI) 23.0-23.9, adult: Secondary | ICD-10-CM | POA: Diagnosis not present

## 2020-04-09 ENCOUNTER — Other Ambulatory Visit: Payer: Self-pay

## 2020-04-09 ENCOUNTER — Ambulatory Visit: Payer: PPO | Admitting: Neurology

## 2020-04-09 ENCOUNTER — Encounter: Payer: Self-pay | Admitting: Neurology

## 2020-04-09 VITALS — BP 106/64 | HR 64 | Ht 68.0 in | Wt 153.0 lb

## 2020-04-09 DIAGNOSIS — G20 Parkinson's disease: Secondary | ICD-10-CM | POA: Diagnosis not present

## 2020-04-09 MED ORDER — CARBIDOPA-LEVODOPA 25-100 MG PO TABS: ORAL_TABLET | ORAL | 5 refills | Status: DC

## 2020-04-09 MED ORDER — ROPINIROLE HCL ER 8 MG PO TB24: 8.0000 mg | ORAL_TABLET | Freq: Every day | ORAL | 3 refills | Status: DC

## 2020-04-09 NOTE — Patient Instructions (Signed)
As discussed, we will continue to monitor your neck tingling symptoms.   Please consider getting a blood pressure machine for home use, you are on the lower end of the normal spectrum currently.  Please try to hydrate well with water, 6 to 8 cups of water per day are recommended, 8 ounces each and try to limit your tea intake to 1 or 2 servings per day.  We will continue with Requip long-acting once daily 8 mg strength.    In addition, we will start Sinemet (generic name: carbidopa-levodopa) 25/100 mg: Take half a pill twice daily (8 AM and noon) for one week, then half a pill 3 times a day (8 AM, noon, and 4 PM) for one week, then one pill 3 times a day thereafter. Please try to take the medication away from you mealtimes, that is, ideally either one hour before or 2 hours after your meal to ensure optimal absorption. The medication can interfere with the protein content of your meal and trying to the protein in your food and therefore not get fully absorbed.  Common side effects reported are: Nausea, vomiting, sedation, confusion, lightheadedness. Rare side effects include hallucinations, severe nausea or vomiting, diarrhea and significant drop in blood pressure especially when going from lying to standing or from sitting to standing.

## 2020-04-09 NOTE — Progress Notes (Signed)
Subjective:    Patient ID: David Krueger is a 69 y.o. male.  HPI     Interim history:  David Krueger is a 69 year old right-handed gentleman with an underlying medical history of allergic rhinitis, hyperlipidemia, and anemia, who presents for follow-up consultation of his central sleep apnea, RBD and Parkinson's disease.  He is accompanied by his wife again today.  I last saw him on 10/16/2019, at which time he was compliant with his BiPAP.  He was able to tolerate Requip.  He wanted to start rock steady boxing for Parkinson's disease.  He was advised to continue with Requip at the current dose.  Today, 04/09/2020: He reports having had some more lightheadedness when he stands up too quickly, thankfully no falls.  Continues to take Requip long-acting 8 mg daily but has noticed an increase in his left hand tremor.  His wife has also noticed that he overall has become slower.  He reports that his slowness is at times intentionally because he wants to be cautious when walking or moving.  However, his wife has also noticed difficulty with fine motor control and slowness when doing simple, day-to-day things such as taking the trash bag out and tying the trash bag.  Cognitively he is doing well.  Activity wise he is doing very well, continues to play golf, likes to work in the yard and has picked up with rock steady boxing.  He tries to hydrate well but also likes his sweet tea.  Mood is stable.  He stopped using his BiPAP sometime after January after a vacation where he did not take his machine with him.  His wife reports that overall his dream and acting behavior is less, his sleep is better, he takes melatonin which has helped.  They are actually able to sleep in the same bed.  He has noticed intermittent tingling sensation in the right neck area, confined to the neck area, sometimes radiates up to the ear area but is not actually painful, feels like pins-and-needles, does not radiate to the arm, no obvious  trigger, no recent heavy lifting or fall thankfully.  Does not actually hurt but he feels it from time to time.  No weakness noted.   The patient's allergies, current medications, family history, past medical history, past social history, past surgical history and problem list were reviewed and updated as appropriate.    Previously:   I saw him on 06/15/2019, at which time he was compliant with his BiPAP.  He was taking Requip 2 mg twice daily.  He had noticed changes in his voice.  He still had some dream enactment behavior.  His wife also noted that his left side tremor was a little worse.  I suggested we gradually increase the Requip.    Later in August, we increased his Requip to once daily long-acting 8 mg strength.    I reviewed his BiPAP compliance data from 09/17/2019 through 10/16/2019 which is a total of 30 days, during which time he used his machine with percent use days greater than 4 hours at nearly 90%, 87%, indicating excellent compliance with an average usage of 4 hours and 27 minutes, residual AHI at goal at 1.5/h, leak acceptable with the 95th percentile at 14 L/min with a pressure of 12/7 with a backup rate of 14.     I saw him on 02/09/2019 in virtual visit, at which time he reported Doing well, no dream enactment slightly, speech had improved since starting Requip.  He  was tolerating it, was taking 2 mg twice daily.  He was physically active.  He did not have any recent constipation issues.  He was struggling a little bit with the BiPAP but overall was motivated to continue.  His wife felt that the Requip had made a significant difference.   I reviewed his BiPAP ST compliance data from 05/16/2019 through 06/14/2019 which is a total of 30 days, during which time he used his machine 26 nights, with percent used days greater than 4 hours at 73%, indicating adequate compliance with an average usage of 4 hours and 31 minutes, residual AHI at goal at 2.8/h, leak acceptable with a 95th percentile  at 12.1 L/min on a pressure of 12/7 with a rate of 14.     I saw him on 12/06/2018, at which time we talked about his recent brain SPECT scan, DaT scan, from 12/01/2018 which showed decreased radiotracer activity within the posterior left and right striata. I suggested we start him on ropinirole with gradual increase and I made a referral to neuro rehabilitation. We also talked about utilizing coenzyme Q10 over-the-counter as a supplement. We also talked about his sleep study results.   He had had a baseline sleep study in December 2019 which showed central sleep apnea, he had a BiPAP titration study in January 2020. His second sleep study showed some changes in keeping with REM behavior disorder.   I reviewed his BiPAP compliance data from 01/03/2019 through 02/01/2019 which is a total of 30 days, during which time he used his BiPAP every night with percent used days greater than 4 hours at 87%, indicating very good compliance with an average usage of 4 hours and 36 minutes, residual AHI at goal at 1.7 per hour, leak acceptable with the 95th percentile at 7.5 L/m on a pressure of 12/7 with a backup rate of 14.   I first met him on 09/21/2018 at the request of his primary care physician, at which time he reported changes in his gait and posture. He also reported a 3+ year history of vivid dreams and dream enactment behavior. His exam showed evidence of parkinsonism with left-sided lateralization. I suggested further workup for parkinsonism including a DaT scan, and sleep study testing.    He had a nuclear medicine DaT scan on 12/01/2018 and I reviewed the results: IMPRESSION: Decreased radiotracer activity (dopamine transporter populations) within the posterior LEFT and RIGHT striata (putamen) is a pattern suggestive of Parkinson's syndrome pathology.   We called him with his test results.    His baseline sleep study in December 2019 showed moderate primary central sleep apnea. He was advised to  return for a BiPAP titration study, which he had in January 2020. During the second sleep study also had some REM related increase in muscle tone and vocalization, in keeping with his history of REM behavior disorder.   09/21/2018: (He) has had some recent changes in his gait and posture. He has had an intermittent tremor, he does not notice his motor changes or posture changes himself but his kids have noticed it and wife has noticed it. He is more concerned about his sleep disturbance. His wife reports that for the past 3+ years he has had intermittent vivid dreams and erratic behaviors and dreams including movements, twitching, and even more dramatic movements such as grabbing her. She sometimes gets scared because of the unpredictable behaviors in his sleep. They still sleep in the same bed together. They have been married 62 years.  She has noticed increase in snoring and sometimes apneic pauses while he is asleep. He sometimes reports vivid dreams to her including fighting with an animal but typically does not have recollection of his dreams. He has a history of Parkinson's disease in paternal grandmother. His own mother lived to be 34 and had trembling or parkinsonism in her 96s. Of note, their son who is currently 63 was diagnosed with essential tremor when he was a child, maybe as young as 71 years old, no significant progression in the tremor and son has not been on any symptomatic treatment for this. Cognitively, patient is fine. He retired last year, he was a Chief Executive Officer. He is very active physically and socially. He likes to golf about 3 days out of the week. They have 3 children, son is the oldest, they have 1 daughter in [place] and one in [place], [place]; she has 2 kids, ages 75 and 90. I reviewed your office note from 09/09/2018, which you kindly included. His Epworth sleepiness score is 8 out of 24 today, fatigue score is 21 out of 63. He is particularly worried about his sleep disturbance. He is married  and lives with his wife, he is a nonsmoker, retired, does not utilize alcohol or caffeine on a regular basis.  His Past Medical History Is Significant For: Past Medical History:  Diagnosis Date  . Allergic rhinitis   . Hypercholesterolemia     His Past Surgical History Is Significant For: Past Surgical History:  Procedure Laterality Date  . VASECTOMY      His Family History Is Significant For: No family history on file.  His Social History Is Significant For: Social History   Socioeconomic History  . Marital status: Married    Spouse name: Not on file  . Number of children: Not on file  . Years of education: Not on file  . Highest education level: Not on file  Occupational History  . Not on file  Tobacco Use  . Smoking status: Never Smoker  . Smokeless tobacco: Never Used  Substance and Sexual Activity  . Alcohol use: Never  . Drug use: Not on file  . Sexual activity: Not on file  Other Topics Concern  . Not on file  Social History Narrative  . Not on file   Social Determinants of Health   Financial Resource Strain:   . Difficulty of Paying Living Expenses:   Food Insecurity:   . Worried About Charity fundraiser in the Last Year:   . Arboriculturist in the Last Year:   Transportation Needs:   . Film/video editor (Medical):   Marland Kitchen Lack of Transportation (Non-Medical):   Physical Activity:   . Days of Exercise per Week:   . Minutes of Exercise per Session:   Stress:   . Feeling of Stress :   Social Connections:   . Frequency of Communication with Friends and Family:   . Frequency of Social Gatherings with Friends and Family:   . Attends Religious Services:   . Active Member of Clubs or Organizations:   . Attends Archivist Meetings:   Marland Kitchen Marital Status:     His Allergies Are:  No Known Allergies:   His Current Medications Are:  Outpatient Encounter Medications as of 04/09/2020  Medication Sig  . fluticasone (FLONASE) 50 MCG/ACT nasal  spray Place into both nostrils daily.  . Melatonin 3 MG TABS Take 3 mg by mouth at bedtime.  . Multiple Vitamins-Minerals (ICAPS  AREDS 2 PO) Take by mouth.  . Multiple Vitamins-Minerals (MULTIVITAMIN WITH MINERALS) tablet Take 1 tablet by mouth daily.  . niacin 250 MG tablet Take 250 mg by mouth 2 (two) times daily with a meal.  . Omega-3 Fatty Acids (FISH OIL) 1000 MG CAPS Take by mouth.  Marland Kitchen rOPINIRole (REQUIP XL) 8 MG 24 hr tablet Take 1 tablet (8 mg total) by mouth at bedtime.  . rosuvastatin (CRESTOR) 10 MG tablet Take 10 mg by mouth at bedtime.   No facility-administered encounter medications on file as of 04/09/2020.  :  Review of Systems:  Out of a complete 14 point review of systems, all are reviewed and negative with the exception of these symptoms as listed below: Review of Systems  Neurological:       Here for f/u on PD. Pt reports left hand tremors have worsened some since last visit. He also report tingling at the bottom of his right ear that radiates down his neck is present now.  He has stopped his bipap. Reports he stopped back in Jan- reports he has been sleeping better.    Objective:  Neurological Exam  Physical Exam Physical Examination:   Vitals:   04/09/20 1043  BP: 106/64  Pulse: 64  SpO2: 96%    General Examination: The patient is a very pleasant 69 y.o. male in no acute distress. He appears well-developed and well-nourished and well groomed.   HEENT:Normocephalic, atraumatic, pupils are equal, round and reactive to light and accommodation. Extraocular tracking is fairly well-preserved, hearing is grossly intact. Face is symmetric, he has moderatefacial masking. He has a very slight intermittent lower jaw and lip tremor. Speech is mild to moderatelyhypophonic.Neckshows moderate nuchal rigidity.all stable. Tongue protrudes centrally in palate elevates symmetrically.  Chest:Clear to auscultation without wheezing, rhonchi or crackles  noted.  Heart:S1+S2+0, regular and normal without murmurs, rubs or gallops noted.   Abdomen:Soft, non-tender and non-distended with normal bowel sounds appreciated on auscultation.  Extremities:There isnopitting edema in the distal lower extremities bilaterally.  Skin: Warm and dry without trophic changes noted.  Musculoskeletal: exam reveals no obvious joint deformities, tenderness or joint swelling or erythema.   Neurologically:  Mental status: The patient is awake, alert and oriented in all 4 spheres.Hisimmediate and remote memory, attention, language skills and fund of knowledge are appropriate. There is no evidence of aphasia, agnosia, apraxia or anomia. Speech is clear with normal prosody and enunciation. Thought process is linear. Mood is normaland affect is normal.  Cranial nerves II - XII are as described above under HEENT exam. In addition: shoulder shrug is unequal with left shoulder slightly higher than right.  Motor exam: Normal bulk, strength and tonewith the exception of mild cogwheeling in the left upper extremity, slight cogwheeling in the right upper extremity also. He has a fairly consistent resting tremor in the left upper extremity.    (On11/13/2019:on David Krueger spiral drawing he has no significant difficulty with his right hand which is his dominant hand, he has mild insecurity but no obvious trembling with the left hand, handwriting is legible, not particularly tremulous, but on the smaller side.)  He has no significant postural or action tremor, no intention tremor. On fine motor testing he hasminimaldifficulty on the right,mild to moderate difficulty on the left noted with finger taps and foot taps. Overall mild slowness.  Cerebellar testing: No dysmetria or intention tremor on finger to nose testing. There is no truncal or gait ataxia.  Sensory exam: intact to light touchin theupper and  lower extremities.  Gait, station and balance:Hestands  without difficulty and posture is mildly stooped for age,stable. He walks with fairly good stride length and pace, but decrease in arm swing on the left. He turns without problems, balance ispreserved.  Assessmentand Plan:   In summary,David W Alexanderis a very pleasant 72 year oldmalewith an underlying medical history of allergic rhinitis, hyperlipidemia, and anemia, whopresents forfollow-up consultation of hisleft-sided predominant Parkinson's disease, central sleep apnea, and REM behavior disorder, now on melatonin.HisDaT scan from 12/01/2018 showed findings supportive of parkinsonian syndrome. He has an approx. 5 year history of sleep disturbance including dream enactment behavior. Motor symptoms date back to about 2 to 3+ years ago, his history and examination are in keeping with left-sided predominant Parkinson's disease. His history and recent sleep study results are supportive of REM behavior disorder as well. He was found to have central sleep apnea and has started BiPAP therapy for thisin January 2020.He was compliant with his BiPAP ST, but stopped using BiPAP in January 2021.  He feels that he sleeps better without it.  He has been on Requip for his Parkinson's disease and we increased it to 6 mg daily in August 2020 and then further to 8 mg which he continues to take.  He has had some progression in his symptoms including worsening of his tremor and more slowness, exam findings are also slightly worse with respect to his fine motor control on the left side and tremor as well as overall slowness.  He has had some lightheadedness upon standing and blood pressure is on the low end of the normal spectrum today.  He is advised to stand up slowly, get his bearings 1st, stay well-hydrated with water and limit his tea intake to 1 or 2 servings per day.  He is furthermore advised to start Sinemet with gradual titration, starting at 25-100 mg strength half a pill twice daily with gradual  increase to 1 pill 3 times daily.  We talked about expectations, potential side effects and limitations of the medication.  He is advised to continue with his Requip as well. He is advised to be proactive about constipation issues. He has been physically quite active.  I provided a new prescription and written instructions for the Sinemet and renewed his prescription for Requip.  He is advised to follow-up routinely in 4 months, sooner if needed.  I answered all their questions today and the patient and his wife are in agreement. I spent 30 minutes in total face-to-face time and in reviewing records during pre-charting, more than 50% of which was spent in counseling and coordination of care, reviewing test results, reviewing medications and treatment regimen and/or in discussing or reviewing the diagnosis of PD, the prognosis and treatment options. Pertinent laboratory and imaging test results that were available during this visit with the patient were reviewed by me and considered in my medical decision making (see chart for details).

## 2020-04-15 ENCOUNTER — Ambulatory Visit: Payer: PPO | Admitting: Neurology

## 2020-04-18 ENCOUNTER — Ambulatory Visit: Payer: PPO | Admitting: Occupational Therapy

## 2020-04-18 ENCOUNTER — Ambulatory Visit: Payer: PPO

## 2020-04-18 ENCOUNTER — Ambulatory Visit: Payer: PPO | Admitting: Physical Therapy

## 2020-05-16 ENCOUNTER — Ambulatory Visit: Payer: PPO | Admitting: Physical Therapy

## 2020-05-16 ENCOUNTER — Ambulatory Visit: Payer: PPO

## 2020-05-16 ENCOUNTER — Ambulatory Visit: Payer: PPO | Admitting: Occupational Therapy

## 2020-05-27 DIAGNOSIS — Z6823 Body mass index (BMI) 23.0-23.9, adult: Secondary | ICD-10-CM | POA: Diagnosis not present

## 2020-05-27 DIAGNOSIS — M25562 Pain in left knee: Secondary | ICD-10-CM | POA: Diagnosis not present

## 2020-06-08 DIAGNOSIS — E785 Hyperlipidemia, unspecified: Secondary | ICD-10-CM | POA: Diagnosis not present

## 2020-06-08 DIAGNOSIS — D508 Other iron deficiency anemias: Secondary | ICD-10-CM | POA: Diagnosis not present

## 2020-08-08 DIAGNOSIS — E785 Hyperlipidemia, unspecified: Secondary | ICD-10-CM | POA: Diagnosis not present

## 2020-08-08 DIAGNOSIS — D508 Other iron deficiency anemias: Secondary | ICD-10-CM | POA: Diagnosis not present

## 2020-08-20 ENCOUNTER — Other Ambulatory Visit: Payer: Self-pay

## 2020-08-20 ENCOUNTER — Encounter: Payer: Self-pay | Admitting: Neurology

## 2020-08-20 ENCOUNTER — Ambulatory Visit: Payer: PPO | Admitting: Neurology

## 2020-08-20 VITALS — BP 110/64 | HR 54 | Ht 68.0 in | Wt 153.0 lb

## 2020-08-20 DIAGNOSIS — G20 Parkinson's disease: Secondary | ICD-10-CM | POA: Diagnosis not present

## 2020-08-20 DIAGNOSIS — G4752 REM sleep behavior disorder: Secondary | ICD-10-CM

## 2020-08-20 MED ORDER — CARBIDOPA-LEVODOPA 25-100 MG PO TABS: 1.0000 | ORAL_TABLET | Freq: Three times a day (TID) | ORAL | 3 refills | Status: DC

## 2020-08-20 NOTE — Patient Instructions (Signed)
It is good to see that you have done well, I believe the Sinemet has helped and staying active mentally and physically is essential.  I would like to maintain the same prescription for both the carbidopa-levodopa as well as the ropinirole.  Your ropinirole prescription is up-to-date and I renewed your prescription for the levodopa for 90 days with refills.  I think we can go 6 months for your next follow-up.  Keep up the good work!

## 2020-08-20 NOTE — Progress Notes (Signed)
Subjective:    Patient ID: David Krueger is a 69 y.o. male.  HPI     Interim history:  David Krueger is a 69 year old right-handed gentleman with an underlying medical history of allergic rhinitis, hyperlipidemia, and anemia, who presents for follow-up consultation of his Parkinson's disease with associated RBD and history of central sleep apnea.  He is accompanied by his wife again today.  I last saw him on 04/09/2020, at which time he reported lightheadedness upon standing up too quickly.  No recent falls.  He was on Requip long-acting 8 mg daily but had noticed an increase in his left hand tremor.  His wife noticed that he had become slower.  Cognitively he was stable.  He had stopped using his BiPAP in January 2021 and reported that he felt better off it.  He was advised to continue with long-acting ropinirole and start Sinemet.  Today, 08/20/2020: He reports feeling better.  He has done rather well in the past few months.  He has started rock steady boxing in [place], goes 3 times a week for an hour and a half and has really benefited from it.  His wife is very pleased with how he is doing at this time.  He has been able to tolerate levodopa and is up to 1 pill 3 times a day, generally on schedule with 1 pill at 7 AM, 1 pill at 11 AM and 1 pill at 3 PM daily.  He continues to take long-acting ropinirole 8 mg each evening.  He has had one recent fall when he was playing with his grandson, he was trying to catch a Frisbee and fell on the golf course.  He did not sustain any injuries thankfully.  He has been trying to hydrate well.  He sleeps quite well, takes melatonin at night which has toned down the REM behavior disorder.  He denies any significant issues with constipation.   The patient's allergies, current medications, family history, past medical history, past social history, past surgical history and problem list were reviewed and updated as appropriate.    Previously:  I saw him on  10/16/2019, at which time he was compliant with his BiPAP.  He was able to tolerate Requip.  He wanted to start rock steady boxing for Parkinson's disease. He was advised to continue with Requip at the current dose.      I saw him on 06/15/2019, at which time he was compliant with his BiPAP.  He was taking Requip 2 mg twice daily.  He had noticed changes in his voice.  He still had some dream enactment behavior.  His wife also noted that his left side tremor was a little worse.  I suggested we gradually increase the Requip.    Later in August, we increased his Requip to once daily long-acting 8 mg strength.    I reviewed his BiPAP compliance data from 09/17/2019 through 10/16/2019 which is a total of 30 days, during which time he used his machine with percent use days greater than 4 hours at nearly 90%, 87%, indicating excellent compliance with an average usage of 4 hours and 27 minutes, residual AHI at goal at 1.5/h, leak acceptable with the 95th percentile at 14 L/min with a pressure of 12/7 with a backup rate of 14.     I saw him on 02/09/2019 in virtual visit, at which time he reported Doing well, no dream enactment slightly, speech had improved since starting Requip.  He was tolerating it,  was taking 2 mg twice daily.  He was physically active.  He did not have any recent constipation issues.  He was struggling a little bit with the BiPAP but overall was motivated to continue.  His wife felt that the Requip had made a significant difference.   I reviewed his BiPAP ST compliance data from 05/16/2019 through 06/14/2019 which is a total of 30 days, during which time he used his machine 26 nights, with percent used days greater than 4 hours at 73%, indicating adequate compliance with an average usage of 4 hours and 31 minutes, residual AHI at goal at 2.8/h, leak acceptable with a 95th percentile at 12.1 L/min on a pressure of 12/7 with a rate of 14.     I saw him on 12/06/2018, at which time we talked about his  recent brain SPECT scan, DaT scan, from 12/01/2018 which showed decreased radiotracer activity within the posterior left and right striata. I suggested we start him on ropinirole with gradual increase and I made a referral to neuro rehabilitation. We also talked about utilizing coenzyme Q10 over-the-counter as a supplement. We also talked about his sleep study results.   He had had a baseline sleep study in December 2019 which showed central sleep apnea, he had a BiPAP titration study in January 2020. His second sleep study showed some changes in keeping with REM behavior disorder.   I reviewed his BiPAP compliance data from 01/03/2019 through 02/01/2019 which is a total of 30 days, during which time he used his BiPAP every night with percent used days greater than 4 hours at 87%, indicating very good compliance with an average usage of 4 hours and 36 minutes, residual AHI at goal at 1.7 per hour, leak acceptable with the 95th percentile at 7.5 L/m on a pressure of 12/7 with a backup rate of 14.   I first met him on 09/21/2018 at the request of his primary care physician, at which time he reported changes in his gait and posture. He also reported a 3+ year history of vivid dreams and dream enactment behavior. His exam showed evidence of parkinsonism with left-sided lateralization. I suggested further workup for parkinsonism including a DaT scan, and sleep study testing.    He had a nuclear medicine DaT scan on 12/01/2018 and I reviewed the results: IMPRESSION: Decreased radiotracer activity (dopamine transporter populations) within the posterior LEFT and RIGHT striata (putamen) is a pattern suggestive of Parkinson's syndrome pathology.   We called him with his test results.    His baseline sleep study in December 2019 showed moderate primary central sleep apnea. He was advised to return for a BiPAP titration study, which he had in January 2020. During the second sleep study also had some REM related  increase in muscle tone and vocalization, in keeping with his history of REM behavior disorder.   09/21/2018: (He) has had some recent changes in his gait and posture. He has had an intermittent tremor, he does not notice his motor changes or posture changes himself but his kids have noticed it and wife has noticed it. He is more concerned about his sleep disturbance. His wife reports that for the past 3+ years he has had intermittent vivid dreams and erratic behaviors and dreams including movements, twitching, and even more dramatic movements such as grabbing her. She sometimes gets scared because of the unpredictable behaviors in his sleep. They still sleep in the same bed together. They have been married 80 years. She has noticed  increase in snoring and sometimes apneic pauses while he is asleep. He sometimes reports vivid dreams to her including fighting with an animal but typically does not have recollection of his dreams. He has a history of Parkinson's disease in paternal grandmother. His own mother lived to be 54 and had trembling or parkinsonism in her 28s. Of note, their son who is currently 110 was diagnosed with essential tremor when he was a child, maybe as young as 48 years old, no significant progression in the tremor and son has not been on any symptomatic treatment for this. Cognitively, patient is fine. He retired last year, he was a Chief Executive Officer. He is very active physically and socially. He likes to golf about 3 days out of the week. They have 3 children, son is the oldest, they have 1 daughter in [place] and one in [place], [place]; she has 2 kids, ages 8 and 35. I reviewed your office note from 09/09/2018, which you kindly included. His Epworth sleepiness score is 8 out of 24 today, fatigue score is 21 out of 63. He is particularly worried about his sleep disturbance. He is married and lives with his wife, he is a nonsmoker, retired, does not utilize alcohol or caffeine on a regular basis.  His Past  Medical History Is Significant For: Past Medical History:  Diagnosis Date  . Allergic rhinitis   . Hypercholesterolemia     His Past Surgical History Is Significant For: Past Surgical History:  Procedure Laterality Date  . VASECTOMY      His Family History Is Significant For: No family history on file.  His Social History Is Significant For: Social History   Socioeconomic History  . Marital status: Married    Spouse name: Not on file  . Number of children: Not on file  . Years of education: Not on file  . Highest education level: Not on file  Occupational History  . Not on file  Tobacco Use  . Smoking status: Never Smoker  . Smokeless tobacco: Never Used  Substance and Sexual Activity  . Alcohol use: Never  . Drug use: Not on file  . Sexual activity: Not on file  Other Topics Concern  . Not on file  Social History Narrative  . Not on file   Social Determinants of Health   Financial Resource Strain:   . Difficulty of Paying Living Expenses: Not on file  Food Insecurity:   . Worried About Charity fundraiser in the Last Year: Not on file  . Ran Out of Food in the Last Year: Not on file  Transportation Needs:   . Lack of Transportation (Medical): Not on file  . Lack of Transportation (Non-Medical): Not on file  Physical Activity:   . Days of Exercise per Week: Not on file  . Minutes of Exercise per Session: Not on file  Stress:   . Feeling of Stress : Not on file  Social Connections:   . Frequency of Communication with Friends and Family: Not on file  . Frequency of Social Gatherings with Friends and Family: Not on file  . Attends Religious Services: Not on file  . Active Member of Clubs or Organizations: Not on file  . Attends Archivist Meetings: Not on file  . Marital Status: Not on file    His Allergies Are:  No Known Allergies:   His Current Medications Are:  Outpatient Encounter Medications as of 08/20/2020  Medication Sig  .  carbidopa-levodopa (SINEMET IR) 25-100  MG tablet Take 1/2 pill twice daily x 1 week, then 1/2 pill 3 times a day x 1 week, then 1 pill 3 times a day thereafter.  . fluticasone (FLONASE) 50 MCG/ACT nasal spray Place into both nostrils daily.  . Melatonin 3 MG TABS Take 3 mg by mouth at bedtime.  . Multiple Vitamins-Minerals (ICAPS AREDS 2 PO) Take by mouth.  . Multiple Vitamins-Minerals (MULTIVITAMIN WITH MINERALS) tablet Take 1 tablet by mouth daily.  . niacin 250 MG tablet Take 250 mg by mouth 2 (two) times daily with a meal.  . Omega-3 Fatty Acids (FISH OIL) 1000 MG CAPS Take by mouth.  Marland Kitchen rOPINIRole (REQUIP XL) 8 MG 24 hr tablet Take 1 tablet (8 mg total) by mouth at bedtime.  . rosuvastatin (CRESTOR) 10 MG tablet Take 10 mg by mouth at bedtime.   No facility-administered encounter medications on file as of 08/20/2020.  :  Review of Systems:  Out of a complete 14 point review of systems, all are reviewed and negative with the exception of these symptoms as listed below:  Review of Systems  Neurological:       Here for f/u on PD. Reports he has been doing very well. Denies any falls since last visit.      Objective:  Neurological Exam  Physical Exam Physical Examination:   Vitals:   08/20/20 1124  BP: 110/64  Pulse: (!) 54  SpO2: 96%    General Examination: The patient is a very pleasant 69 y.o. male in no acute distress. He appears well-developed and well-nourished and well groomed. Good spirits.   HEENT:Normocephalic, atraumatic, pupils are equal, round and reactive to light and accommodation. Extraocular tracking is fairly well-preserved, hearing is grossly intact. Face is symmetric, he has moderatefacial masking. He has a very slight intermittent lower jaw and lip tremor. Speech is mild to moderatelyhypophonic.Neckshows moderate nuchal rigidity, stable.Tongue protrudes centrally in palate elevates symmetrically.  Chest:Clear to auscultation without wheezing,  rhonchi or crackles noted.  Heart:S1+S2+0, regular and normal without murmurs, rubs or gallops noted.   Abdomen:Soft, non-tender and non-distended with normal bowel sounds appreciated on auscultation.  Extremities:There isnopitting edema in the distal lower extremities bilaterally.  Skin: Warm and dry without trophic changes noted.  Musculoskeletal: exam reveals no obvious joint deformities, tenderness or joint swelling or erythema.   Neurologically:  Mental status: The patient is awake, alert and oriented in all 4 spheres.Hisimmediate and remote memory, attention, language skills and fund of knowledge are appropriate. There is no evidence of aphasia, agnosia, apraxia or anomia. Speech is clear with normal prosody and enunciation. Thought process is linear. Mood is normaland affect is normal.  Cranial nerves II - XII are as described above under HEENT exam. In addition: shoulder shrug is unequal with left shoulder slightly higher than right.  Motor exam: Normal bulk, strength and tonewith the exception of mild cogwheeling in the left upper extremity, slight cogwheeling in the right upper extremity also. He has a fairly consistent resting tremor in the left upper extremity.    (On11/13/2019:on Archimedes spiral drawing he has no significant difficulty with his right hand which is his dominant hand, he has mild insecurity but no obvious trembling with the left hand, handwriting is legible, not particularly tremulous, but on the smaller side.)  He has no significant postural or action tremor, no intention tremor. On fine motor testing he hasminimaldifficulty on the right,mild to moderate difficulty on the left noted with finger taps and foot taps. Overall mild slowness.  Cerebellar testing: No dysmetria or intention tremor on finger to nose testing. There is no truncal or gait ataxia.  Sensory exam: intact to light touchin theupper and lower extremities.  Gait, station and  balance:Hestands without difficulty and posture is mildly stooped for age,stable.He walks with fairly good stride length and pace, but decrease in arm swing on the left. He turns without problems, balance ispreserved.  Assessmentand Plan:   In summary,Tylique W Alexanderis a very pleasant73 year oldmalewith an underlying medical history of allergic rhinitis, hyperlipidemia, and anemia, whopresents forfollow-up consultation of hisleft-sided predominant Parkinson's disease, with history of central sleep apnea, and REM behavior disorder.  He has been on melatonin which has helped his treatment have been behavior.  He could not tolerate BiPAP and eventually stopped using the machine.  He had a DaTsca on 12/01/2018 which showed findings supportive of parkinsonian syndrome. He has an approx. 5+year history of sleep disturbance including dream enactment behavior. Motor symptoms date back to about 3years ago, History and examination are in keeping with left-sided predominant Parkinson's disease. His history and prior sleep study results were supportive of REM behavior disorder as well. He was found to have central sleep apnea and has started BiPAP therapy for thisin January 2020.He was compliant with his BiPAP ST, but stopped using BiPAP in January 2021.  He feels that he sleeps better without it.  He has been on Requip for his Parkinson's disease and we increased it to 6 mg daily in August 2020 and then further to 8 mg which he continues to take.  We started Sinemet with gradual titration in June 2021.  He has benefited from it and tolerates it.  He has also started rock steady boxing in [place] and has greatly benefited from it. He has had some lightheadedness upon standing and blood pressure is on the low end of the normal spectrum today.  He is advised to stand up slowly, get his bearings 1st, stay well-hydrated with water.  I renewed his prescription for Sinemet for 90 days with refills, his  Requip prescription was up-to-date.  He is doing rather well.  He does not currently have any issues with constipation.  He is advised to follow-up in 6 months, sooner if needed.  I answered all their questions today and the patient and his wife are in agreement.   I spent 30 minutes in total face-to-face time and in reviewing records during pre-charting, more than 50% of which was spent in counseling and coordination of care, reviewing test results, reviewing medications and treatment regimen and/or in discussing or reviewing the diagnosis of PD, the prognosis and treatment options. Pertinent laboratory and imaging test results that were available during this visit with the patient were reviewed by me and considered in my medical decision making (see chart for details).

## 2020-10-07 DIAGNOSIS — M25562 Pain in left knee: Secondary | ICD-10-CM | POA: Diagnosis not present

## 2020-10-08 IMAGING — NM NM DATSCAN
4 series · 33 of 40 positions shown · non-contrast
Comparison: None.

CLINICAL DATA: LEFT hand tremors. Sharp filling gait. Symptoms for
2 years. 67-year-old male

EXAM:
NUCLEAR MEDICINE BRAIN IMAGING WITH SPECT  (DaTscan )
TECHNIQUE: SPECT images of the brain were obtained after intravenous injection
of radiopharmaceutical. 4 hour post injection imaging. Appropriate
positioning. 0.8 ml lugols solution administered in a.m
RADIOPHARMACEUTICALS:  4.8 millicuries I 123 Ioflupane

[Series 1: brain spect · 4.14mm/px · 5 of 120 frames shown]
[frame 11/120  full-range]
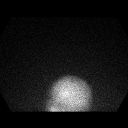
[frame 31/120  full-range]
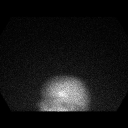
[frame 71/120  full-range]
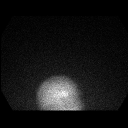
[frame 91/120  full-range]
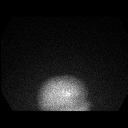
[frame 111/120  full-range]
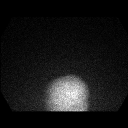

[Series 1: spect - (id) _(id)_cor · 4.1mm · 4.14mm/px · 5 of 128 frames shown]
[frame 11/128]
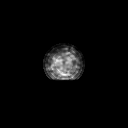
[frame 32/128]
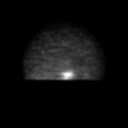
[frame 75/128]
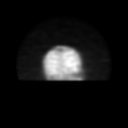
[frame 96/128]
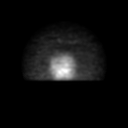
[frame 118/128]
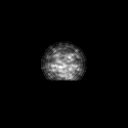

[Series 1: spect - (id) _(id)_tra · 4.1mm · 4.14mm/px · 5 of 128 frames shown]
[frame 11/128]
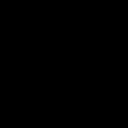
[frame 32/128]
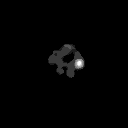
[frame 75/128]
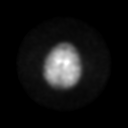
[frame 96/128]
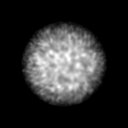
[frame 118/128]
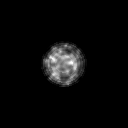

[Series 1016: mpr (id) range · 0.72mm/px · 18 of 45 slices shown]
[im 1/45]
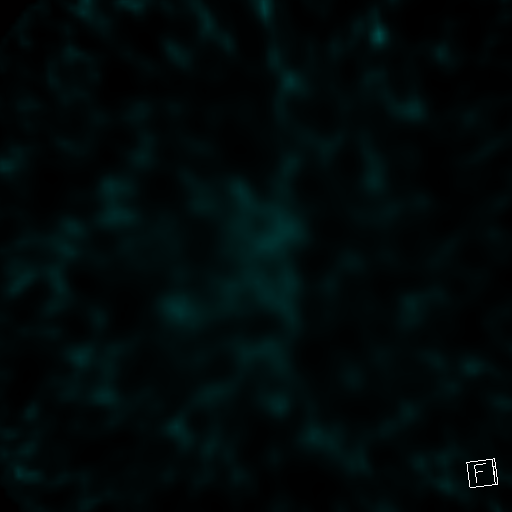
[im 5/45]
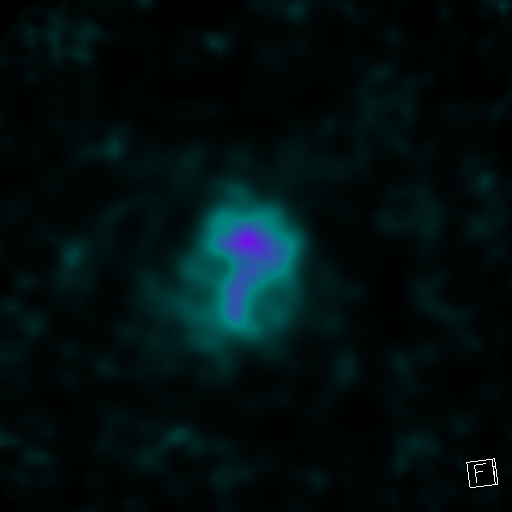
[im 7/45]
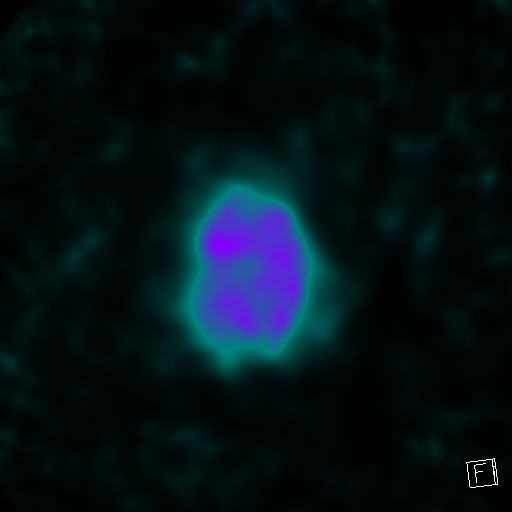
[im 9/45]
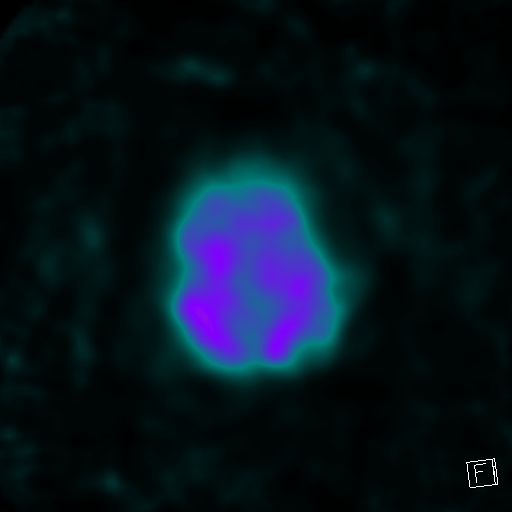
[im 11/45]
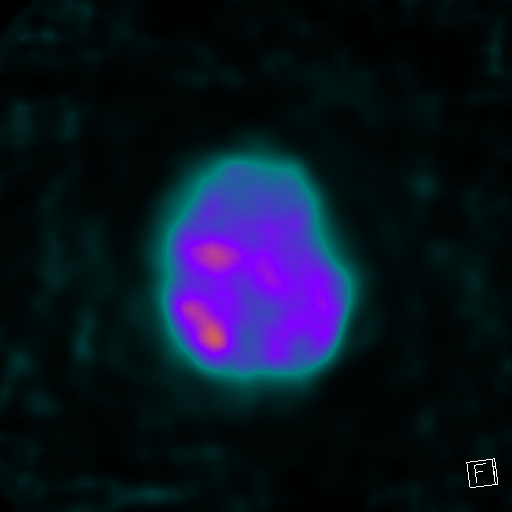
[im 13/45]
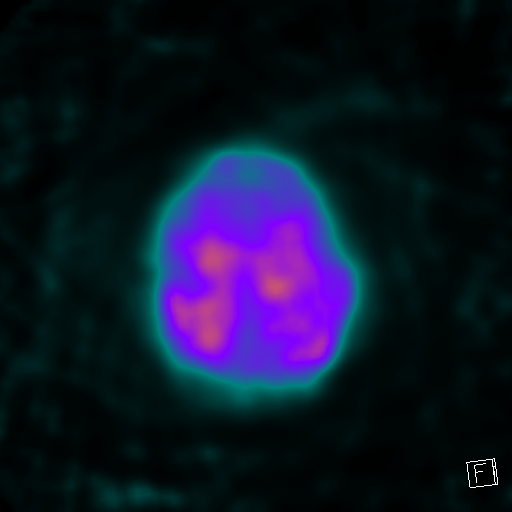
[im 17/45]
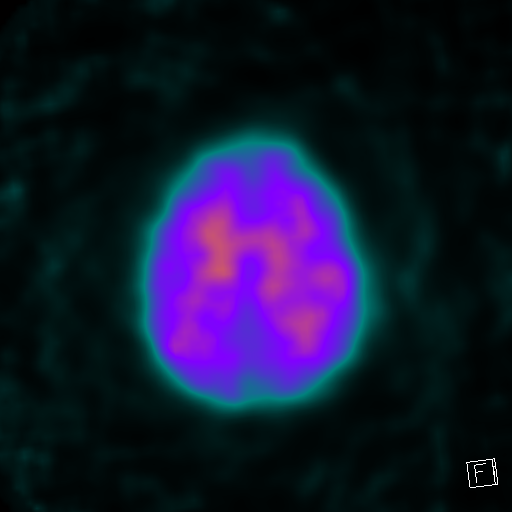
[im 19/45]
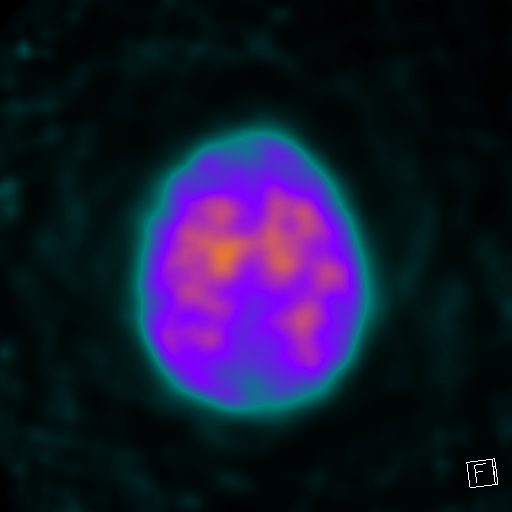
[im 21/45]
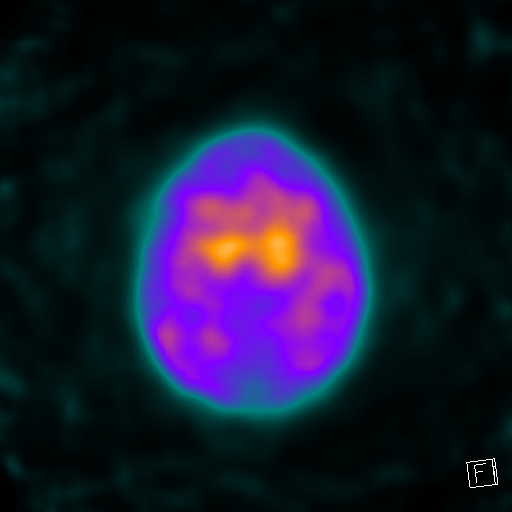
[im 24/45]
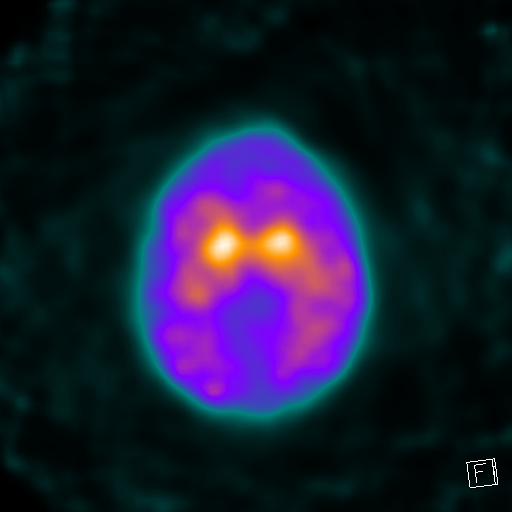
[im 26/45]
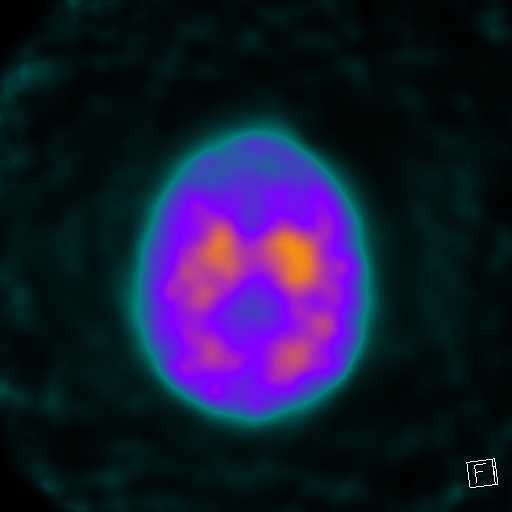
[im 30/45]
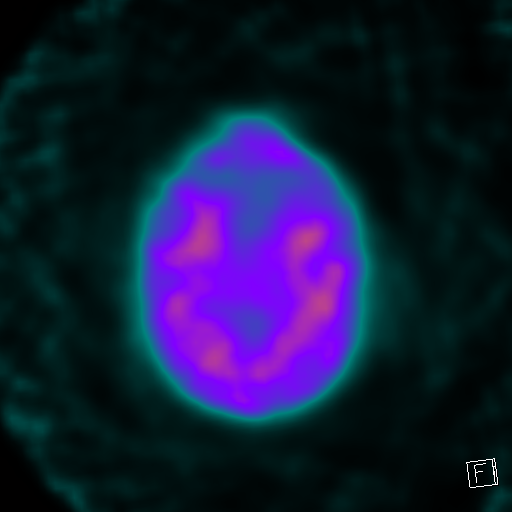
[im 32/45]
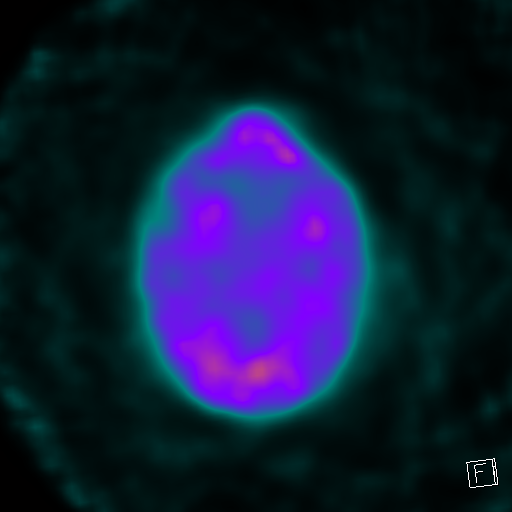
[im 34/45]
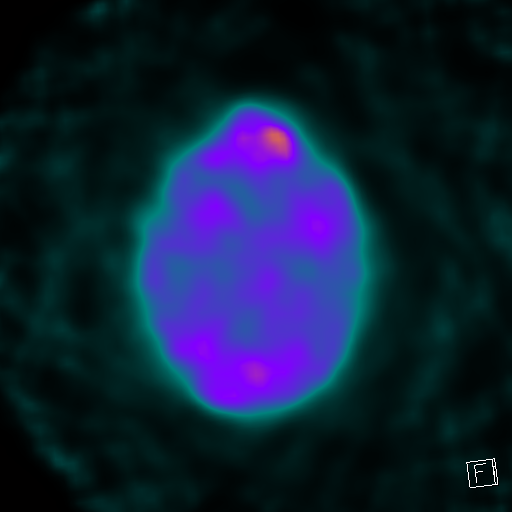
[im 36/45]
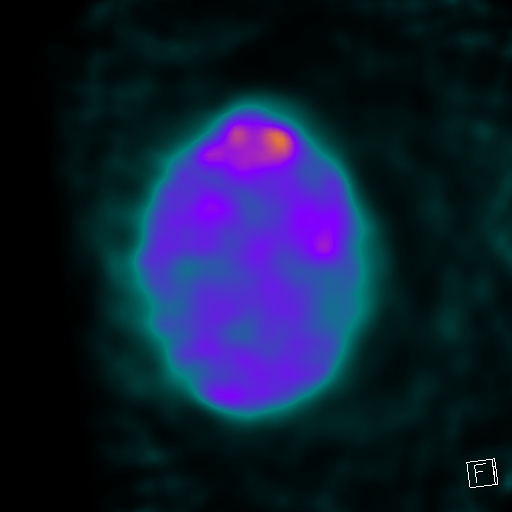
[im 38/45]
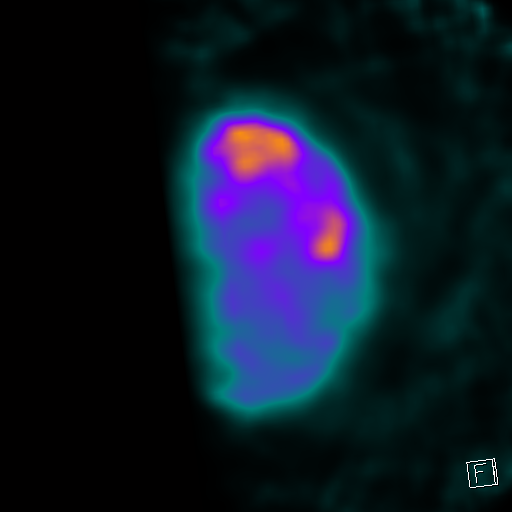
[im 42/45]
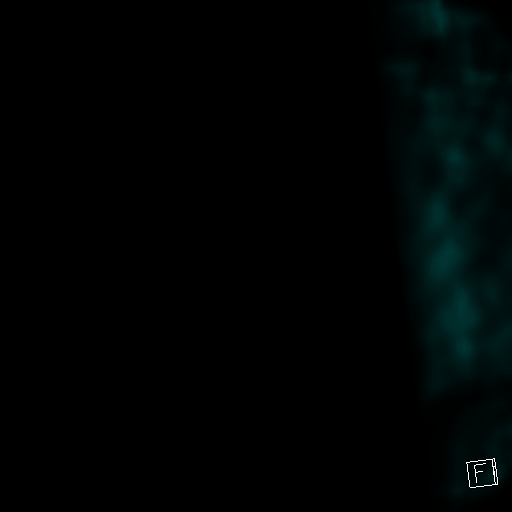
[im 45/45]
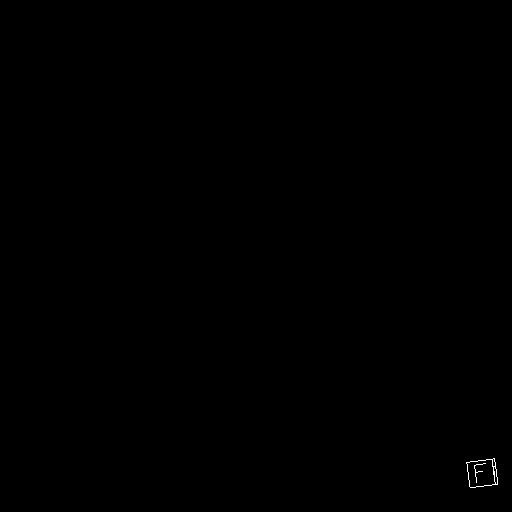

[33 of 40 positions shown; findings below may reference images not displayed]

FINDINGS: There is decreased activity within the LEFT and RIGHT putamen which
is symmetric. Activity in heads of the caudate nuclei is relatively
preserved and symmetric.
IMPRESSION: Decreased radiotracer activity (dopamine transporter populations)
within the posterior LEFT and RIGHT striata (putamen) is a pattern
suggestive of Parkinson's syndrome pathology.

## 2020-10-16 DIAGNOSIS — L719 Rosacea, unspecified: Secondary | ICD-10-CM | POA: Diagnosis not present

## 2020-11-11 DIAGNOSIS — M25562 Pain in left knee: Secondary | ICD-10-CM | POA: Diagnosis not present

## 2020-11-21 DIAGNOSIS — Z6824 Body mass index (BMI) 24.0-24.9, adult: Secondary | ICD-10-CM | POA: Diagnosis not present

## 2020-11-21 DIAGNOSIS — K4090 Unilateral inguinal hernia, without obstruction or gangrene, not specified as recurrent: Secondary | ICD-10-CM | POA: Diagnosis not present

## 2020-11-21 DIAGNOSIS — Z9181 History of falling: Secondary | ICD-10-CM | POA: Diagnosis not present

## 2020-11-21 DIAGNOSIS — G20 Parkinson's disease: Secondary | ICD-10-CM | POA: Diagnosis not present

## 2020-11-27 DIAGNOSIS — K4020 Bilateral inguinal hernia, without obstruction or gangrene, not specified as recurrent: Secondary | ICD-10-CM | POA: Diagnosis not present

## 2020-12-02 DIAGNOSIS — Z1152 Encounter for screening for COVID-19: Secondary | ICD-10-CM | POA: Diagnosis not present

## 2020-12-06 DIAGNOSIS — E785 Hyperlipidemia, unspecified: Secondary | ICD-10-CM | POA: Diagnosis not present

## 2020-12-06 DIAGNOSIS — G2581 Restless legs syndrome: Secondary | ICD-10-CM | POA: Diagnosis not present

## 2020-12-06 DIAGNOSIS — G20 Parkinson's disease: Secondary | ICD-10-CM | POA: Diagnosis not present

## 2020-12-06 DIAGNOSIS — K4020 Bilateral inguinal hernia, without obstruction or gangrene, not specified as recurrent: Secondary | ICD-10-CM | POA: Diagnosis not present

## 2020-12-06 DIAGNOSIS — E7800 Pure hypercholesterolemia, unspecified: Secondary | ICD-10-CM | POA: Diagnosis not present

## 2020-12-06 DIAGNOSIS — G8918 Other acute postprocedural pain: Secondary | ICD-10-CM | POA: Diagnosis not present

## 2020-12-06 DIAGNOSIS — Z79899 Other long term (current) drug therapy: Secondary | ICD-10-CM | POA: Diagnosis not present

## 2020-12-06 DIAGNOSIS — G4733 Obstructive sleep apnea (adult) (pediatric): Secondary | ICD-10-CM | POA: Diagnosis not present

## 2021-01-06 DIAGNOSIS — Z79899 Other long term (current) drug therapy: Secondary | ICD-10-CM | POA: Diagnosis not present

## 2021-01-06 DIAGNOSIS — E7800 Pure hypercholesterolemia, unspecified: Secondary | ICD-10-CM | POA: Diagnosis not present

## 2021-01-06 DIAGNOSIS — Z125 Encounter for screening for malignant neoplasm of prostate: Secondary | ICD-10-CM | POA: Diagnosis not present

## 2021-01-08 DIAGNOSIS — G20 Parkinson's disease: Secondary | ICD-10-CM | POA: Diagnosis not present

## 2021-01-08 DIAGNOSIS — E7800 Pure hypercholesterolemia, unspecified: Secondary | ICD-10-CM | POA: Diagnosis not present

## 2021-01-08 DIAGNOSIS — Z6824 Body mass index (BMI) 24.0-24.9, adult: Secondary | ICD-10-CM | POA: Diagnosis not present

## 2021-01-08 DIAGNOSIS — Z1331 Encounter for screening for depression: Secondary | ICD-10-CM | POA: Diagnosis not present

## 2021-01-08 DIAGNOSIS — Z0000 Encounter for general adult medical examination without abnormal findings: Secondary | ICD-10-CM | POA: Diagnosis not present

## 2021-02-18 ENCOUNTER — Ambulatory Visit: Payer: PPO | Admitting: Neurology

## 2021-02-18 ENCOUNTER — Encounter: Payer: Self-pay | Admitting: Neurology

## 2021-02-18 ENCOUNTER — Other Ambulatory Visit: Payer: Self-pay

## 2021-02-18 VITALS — BP 112/74 | HR 58 | Ht 68.0 in | Wt 160.0 lb

## 2021-02-18 DIAGNOSIS — G4752 REM sleep behavior disorder: Secondary | ICD-10-CM

## 2021-02-18 DIAGNOSIS — R6889 Other general symptoms and signs: Secondary | ICD-10-CM | POA: Diagnosis not present

## 2021-02-18 DIAGNOSIS — G20 Parkinson's disease: Secondary | ICD-10-CM | POA: Diagnosis not present

## 2021-02-18 MED ORDER — CARBIDOPA-LEVODOPA 25-100 MG PO TABS: 1.0000 | ORAL_TABLET | Freq: Four times a day (QID) | ORAL | 3 refills | Status: DC

## 2021-02-18 MED ORDER — ROPINIROLE HCL ER 8 MG PO TB24: 8.0000 mg | ORAL_TABLET | Freq: Every day | ORAL | 3 refills | Status: DC

## 2021-02-18 NOTE — Progress Notes (Signed)
Subjective:    Patient ID: David Krueger is a 70 y.o. male.  HPI     Interim history:   Mr. Schlender is a 70 year old right-handed gentleman with an underlying medical history of allergic rhinitis, hyperlipidemia, and anemia, who presents for follow-up consultation of his Parkinson's disease with associated RBD and history of central sleep apnea.  He is accompanied by his wife again today.  I last saw him on 08/20/2020, at which time he reported feeling well.  He was active and enrolled in rock steady boxing for PD.  He was able to tolerate levodopa and was taking 1 pill 3 times daily.  He was on once daily long-acting ropinirole 8 mg strength as well.  He reported 1 recent fall without any major injuries thankfully, he fell while playing Frisbee with his grandson.  He was advised to continue with his current medication regimen and follow-up routinely in 6 months.  Today, 02/18/2021: He reports generally doing well.  He has not had any falls, continues to stay active and plays golf and also does the Parkinson's boxing 3 days out of the week which has been very beneficial.  His wife has noticed more tremor in his right hand.  She has noticed forgetfulness, he tends to forget conversations or ask repeated questions from time to time, it is not progressive.  She has noticed forgetfulness in the past few years, even when he was still working he had had some forgetfulness.  He drives well.  He tries to hydrate well.  He had double hernia surgery in January, surgery was done in [place].  He had a follow-up appointment with Dr. Lovie Macadamia on 12/19/2020 and I reviewed the note.  He could not play golf for a while which was difficult for him.   The patient's allergies, current medications, family history, past medical history, past social history, past surgical history and problem list were reviewed and updated as appropriate.    Previously:   I saw him on 04/09/2020, at which time he reported  lightheadedness upon standing up too quickly.  No recent falls.  He was on Requip long-acting 8 mg daily but had noticed an increase in his left hand tremor.  His wife noticed that he had become slower.  Cognitively he was stable.  He had stopped using his BiPAP in January 2021 and reported that he felt better off it.  He was advised to continue with long-acting ropinirole and start Sinemet.     I saw him on 10/16/2019, at which time he was compliant with his BiPAP.  He was able to tolerate Requip.  He wanted to start rock steady boxing for Parkinson's disease. He was advised to continue with Requip at the current dose.      I saw him on 06/15/2019, at which time he was compliant with his BiPAP.  He was taking Requip 2 mg twice daily.  He had noticed changes in his voice.  He still had some dream enactment behavior.  His wife also noted that his left side tremor was a little worse.  I suggested we gradually increase the Requip.    Later in August, we increased his Requip to once daily long-acting 8 mg strength.    I reviewed his BiPAP compliance data from 09/17/2019 through 10/16/2019 which is a total of 30 days, during which time he used his machine with percent use days greater than 4 hours at nearly 90%, 87%, indicating excellent compliance with an average usage of 4  hours and 27 minutes, residual AHI at goal at 1.5/h, leak acceptable with the 95th percentile at 14 L/min with a pressure of 12/7 with a backup rate of 14.     I saw him on 02/09/2019 in virtual visit, at which time he reported Doing well, no dream enactment slightly, speech had improved since starting Requip.  He was tolerating it, was taking 2 mg twice daily.  He was physically active.  He did not have any recent constipation issues.  He was struggling a little bit with the BiPAP but overall was motivated to continue.  His wife felt that the Requip had made a significant difference.   I reviewed his BiPAP ST compliance data from 05/16/2019  through 06/14/2019 which is a total of 30 days, during which time he used his machine 26 nights, with percent used days greater than 4 hours at 73%, indicating adequate compliance with an average usage of 4 hours and 31 minutes, residual AHI at goal at 2.8/h, leak acceptable with a 95th percentile at 12.1 L/min on a pressure of 12/7 with a rate of 14.     I saw him on 12/06/2018, at which time we talked about his recent brain SPECT scan, DaT scan, from 12/01/2018 which showed decreased radiotracer activity within the posterior left and right striata. I suggested we start him on ropinirole with gradual increase and I made a referral to neuro rehabilitation. We also talked about utilizing coenzyme Q10 over-the-counter as a supplement. We also talked about his sleep study results.   He had had a baseline sleep study in December 2019 which showed central sleep apnea, he had a BiPAP titration study in January 2020. His second sleep study showed some changes in keeping with REM behavior disorder.   I reviewed his BiPAP compliance data from 01/03/2019 through 02/01/2019 which is a total of 30 days, during which time he used his BiPAP every night with percent used days greater than 4 hours at 87%, indicating very good compliance with an average usage of 4 hours and 36 minutes, residual AHI at goal at 1.7 per hour, leak acceptable with the 95th percentile at 7.5 L/m on a pressure of 12/7 with a backup rate of 14.   I first met him on 09/21/2018 at the request of his primary care physician, at which time he reported changes in his gait and posture. He also reported a 3+ year history of vivid dreams and dream enactment behavior. His exam showed evidence of parkinsonism with left-sided lateralization. I suggested further workup for parkinsonism including a DaT scan, and sleep study testing.    He had a nuclear medicine DaT scan on 12/01/2018 and I reviewed the results: IMPRESSION: Decreased radiotracer activity  (dopamine transporter populations) within the posterior LEFT and RIGHT striata (putamen) is a pattern suggestive of Parkinson's syndrome pathology.   We called him with his test results.    His baseline sleep study in December 2019 showed moderate primary central sleep apnea. He was advised to return for a BiPAP titration study, which he had in January 2020. During the second sleep study also had some REM related increase in muscle tone and vocalization, in keeping with his history of REM behavior disorder.   09/21/2018: (He) has had some recent changes in his gait and posture. He has had an intermittent tremor, he does not notice his motor changes or posture changes himself but his kids have noticed it and wife has noticed it. He is more concerned about  his sleep disturbance. His wife reports that for the past 3+ years he has had intermittent vivid dreams and erratic behaviors and dreams including movements, twitching, and even more dramatic movements such as grabbing her. She sometimes gets scared because of the unpredictable behaviors in his sleep. They still sleep in the same bed together. They have been married 45 years. She has noticed increase in snoring and sometimes apneic pauses while he is asleep. He sometimes reports vivid dreams to her including fighting with an animal but typically does not have recollection of his dreams. He has a history of Parkinson's disease in paternal grandmother. His own mother lived to be 67 and had trembling or parkinsonism in her 70s. Of note, their son who is currently 96 was diagnosed with essential tremor when he was a child, maybe as young as 70 years old, no significant progression in the tremor and son has not been on any symptomatic treatment for this. Cognitively, patient is fine. He retired last year, he was a Chief Executive Officer. He is very active physically and socially. He likes to golf about 3 days out of the week. They have 3 children, son is the oldest, they have 1  daughter in [place] and one in [place], [place]; she has 2 kids, ages 48 and 30. I reviewed your office note from 09/09/2018, which you kindly included. His Epworth sleepiness score is 8 out of 24 today, fatigue score is 21 out of 63. He is particularly worried about his sleep disturbance. He is married and lives with his wife, he is a nonsmoker, retired, does not utilize alcohol or caffeine on a regular basis.  His Past Medical History Is Significant For: Past Medical History:  Diagnosis Date  . Allergic rhinitis   . Hypercholesterolemia     His Past Surgical History Is Significant For: Past Surgical History:  Procedure Laterality Date  . VASECTOMY      His Family History Is Significant For: No family history on file.  His Social History Is Significant For: Social History   Socioeconomic History  . Marital status: Married    Spouse name: Not on file  . Number of children: Not on file  . Years of education: Not on file  . Highest education level: Not on file  Occupational History  . Not on file  Tobacco Use  . Smoking status: Never Smoker  . Smokeless tobacco: Never Used  Substance and Sexual Activity  . Alcohol use: Never  . Drug use: Not on file  . Sexual activity: Not on file  Other Topics Concern  . Not on file  Social History Narrative  . Not on file   Social Determinants of Health   Financial Resource Strain: Not on file  Food Insecurity: Not on file  Transportation Needs: Not on file  Physical Activity: Not on file  Stress: Not on file  Social Connections: Not on file    His Allergies Are:  No Known Allergies:   His Current Medications Are:  Outpatient Encounter Medications as of 02/18/2021  Medication Sig  . fluticasone (FLONASE) 50 MCG/ACT nasal spray Place into both nostrils daily.  . Melatonin 3 MG TABS Take 3 mg by mouth at bedtime.  . Multiple Vitamins-Minerals (ICAPS AREDS 2 PO) Take by mouth.  . Multiple Vitamins-Minerals (MULTIVITAMIN WITH  MINERALS) tablet Take 1 tablet by mouth daily.  . niacin 250 MG tablet Take 250 mg by mouth 2 (two) times daily with a meal.  . Omega-3 Fatty Acids (FISH OIL)  1000 MG CAPS Take by mouth.  . rosuvastatin (CRESTOR) 10 MG tablet Take 10 mg by mouth at bedtime.  . [DISCONTINUED] carbidopa-levodopa (SINEMET IR) 25-100 MG tablet Take 1 tablet by mouth 3 (three) times daily. Take at 7 AM, 11 AM and 3 PM daily  . [DISCONTINUED] rOPINIRole (REQUIP XL) 8 MG 24 hr tablet Take 1 tablet (8 mg total) by mouth at bedtime.  . carbidopa-levodopa (SINEMET IR) 25-100 MG tablet Take 1 tablet by mouth 4 (four) times daily. Take at 6 AM, 10 AM and 2 PM and 6 PM daily  . rOPINIRole (REQUIP XL) 8 MG 24 hr tablet Take 1 tablet (8 mg total) by mouth at bedtime.   No facility-administered encounter medications on file as of 02/18/2021.  :  Review of Systems:  Out of a complete 14 point review of systems, all are reviewed and negative with the exception of these symptoms as listed below: Review of Systems  Neurological:       Here for 6 month f/u. He is doing well.     Objective:  Neurological Exam  Physical Exam Physical Examination:   Vitals:   02/18/21 1257  BP: 112/74  Pulse: (!) 58  SpO2: 98%    General Examination: The patient is a very pleasant 70 y.o. male in no acute distress. He appears well-developed and well-nourished and well groomed.   HEENT:Normocephalic, atraumatic, pupils are equal, round and reactive to light and accommodation. Extraocular tracking is fairly well-preserved, hearing is grossly intact. Face is symmetric, with moderatefacial masking. He has a very slight intermittent lower jaw and lip tremor. Speech is mild to moderatelyhypophonic.Neckshows mild to moderate nuchal rigidity, stable.Tongue protrudes centrally in palate elevates symmetrically.  Chest:Clear to auscultation without wheezing, rhonchi or crackles noted.  Heart:S1+S2+0, regular and normal without murmurs,  rubs or gallops noted.   Abdomen:Soft, non-tender and non-distended.  Extremities:There isnopitting edema in the distal lower extremities bilaterally.  Skin: Warm and dry without trophic changes noted.  Musculoskeletal: exam reveals no obvious joint deformities, tenderness or joint swelling or erythema.   Neurologically:  Mental status: The patient is awake, alert and oriented in all 4 spheres.Hisimmediate and remote memory, attention, language skills and fund of knowledge are appropriate. There is no evidence of aphasia, agnosia, apraxia or anomia. Speech is clear with normal prosody and enunciation. Thought process is linear. Mood is normaland affect is normal. Good spirits.  Cranial nerves II - XII are as described above under HEENT exam. In addition: shoulder shrug isunequal with left shoulder slightly higher than right, more noticeable. Motor exam: Normal bulk, strength and tonewith the exception of mild cogwheeling in the left upper extremity, slight cogwheeling in the right upper extremity also. He has afairlyconsistent resting tremor in the left upper extremity, mild and intermittent resting tremor in the right upper extremity.  No lower extremity tremors noted.  (On11/13/2019:on Archimedes spiral drawing he has no significant difficulty with his right hand which is his dominant hand, he has mild insecurity but no obvious trembling with the left hand, handwriting is legible, not particularly tremulous, but on the smaller side.)  He has no significant postural or action tremor, no intention tremor. On fine motor testing he hasminimalto mild difficulty on the right,mildto moderatedifficulty on the left noted with finger taps and foot taps.Overall mild slowness. Cerebellar testing: No dysmetria or intention tremor on finger to nose testing. There is no truncal or gait ataxia.  Sensory exam: intact to light touchin theupper and lower extremities.  Gait, station  and balance:Hestands without difficulty and posture is mildly stooped for age,stable.He walks with fairly good stride length and pace, but decrease in arm swing on the left. He turns without problems, balance ispreserved.  Assessmentand Plan:   In summary,Ab W Alexanderis a very pleasant35 year oldmalewith an underlying medical history of allergic rhinitis, hyperlipidemia, and anemia, whopresents forfollow-up consultation of hisleft-sided predominant Parkinson's disease,  associated with sleep disordered breathing including central sleep apnea, REM behavior disorder, and more recently mild forgetfulness.  We will do an MMSE at the next visit.  He has been on melatonin which has been helpful, he is advised to increase from 3 mg to 6 mg at this time.  Unfortunately, he could not tolerate BiPAP and eventually stopped using the machine.  Of note, he had a DaTscan on 12/01/2018 which showed findings supportive of parkinsonian syndrome. He has an approx.5 to 6+year history of sleep disturbance including dream enactment behavior. Motor symptoms date back to about 3to 4 years ago. History and examination arein keeping withleft-sided predominant Parkinson's disease. His history and prior sleep study results were supportive of REM behavior disorder as well. He was found to have central sleep apnea and has started BiPAP therapy for thisin January 2020.Hewas compliant with his BiPAP ST, but stopped using BiPAP in January 2021.He feels that he sleeps better without it. He has been on Requip for his Parkinson's disease and we increased it to 6 mg daily in August 2020 and then further to 8 mg which he continues to take.  He does have some daytime somnolence and may take a nap, typically no longer than 20 minutes and not daily and certainly not multiple times a day.  We started Sinemet with gradual titration in June 2021.  He has benefited from it and tolerates it.  He has also started rock steady  boxing in [place] and has greatly benefited from it.  He had recent hernia surgery in January 2022 with bilateral repair and a follow-up appointment in February 2022. He has had some lightheadedness upon standing and blood pressure tends to be on the lower end of normal.  He has had some interim increase in his right hand tremor.  His wife has noticed more forgetfulness but has noticed that for the past few years.  We will continue to monitor, we will plan an MMSE for next time.  He is advised to increase his Sinemet at this time to 1 pill 4 times a day, he takes it 4 hourly starting at 6 AM and this will add a 6 PM dose.  I have updated his Sinemet prescription and renewed his ropinirole prescription.  Overall, he is doing well.  He is advised to follow-up routinely in 6 months, sooner if needed.  I answered all their questions today and the patient and his wife are in agreement.   I spent 35 minutes in total face-to-face time and in reviewing records during pre-charting, more than 50% of which was spent in counseling and coordination of care, reviewing test results, reviewing medications and treatment regimen and/or in discussing or reviewing the diagnosis of PD, the prognosis and treatment options. Pertinent laboratory and imaging test results that were available during this visit with the patient were reviewed by me and considered in my medical decision making (see chart for details).

## 2021-02-18 NOTE — Patient Instructions (Signed)
It was nice to see you both again.  I do believe you are doing really well.  Please continue with your active and healthy lifestyle.  As discussed, we will even out your levodopa throughout the day and add a 4th pill, please take 1 pill 4 times a day, namely at 6 AM, 10 AM, 2 PM and 6 PM daily.  Please try to hydrate well with water, exercise regularly, try not to overdo it.  You can increase your melatonin to 6 mg each night.  This may help your dream enactment behavior, perhaps you will sleep a little better at night and you may not be sleepy or as sleepy during the day.  We will monitor your memory, and screen for memory loss.  For now, we we will plan a follow-up in 6 months routinely.  Call or email [place] through MyChart for any interim concerns or questions.

## 2021-08-20 ENCOUNTER — Encounter: Payer: Self-pay | Admitting: Neurology

## 2021-08-20 ENCOUNTER — Ambulatory Visit: Payer: PPO | Admitting: Neurology

## 2021-08-20 ENCOUNTER — Other Ambulatory Visit: Payer: Self-pay

## 2021-08-20 VITALS — BP 114/71 | HR 52 | Ht 68.0 in | Wt 160.6 lb

## 2021-08-20 DIAGNOSIS — R6889 Other general symptoms and signs: Secondary | ICD-10-CM

## 2021-08-20 DIAGNOSIS — G20 Parkinson's disease: Secondary | ICD-10-CM | POA: Diagnosis not present

## 2021-08-20 DIAGNOSIS — R498 Other voice and resonance disorders: Secondary | ICD-10-CM

## 2021-08-20 DIAGNOSIS — G4752 REM sleep behavior disorder: Secondary | ICD-10-CM

## 2021-08-20 NOTE — Patient Instructions (Signed)
It was nice to see you both again today.  As discussed, we can stay the course and continue with your current medication regimen with Sinemet 4 times a day and Requip long-acting once daily.  You can consider increasing your melatonin to 10 mg each night if you feel like you are having trouble maintaining sleep and have more episodes of active dreams.  You can also keep it the same for now with 6 mg each night.  We can consider a referral to speech therapy for evaluation since your voice has become a little weaker.  Please continue to stay active mentally and physically, try to rest well and hydrate well with water.  I think you are doing great in all these aspects, monitor for constipation.  Follow-up in 6 months, sooner if needed.  Stay in touch via MyChart messaging as needed.

## 2021-08-20 NOTE — Progress Notes (Signed)
Subjective:    Patient ID: David Krueger is a 70 y.o. male.  HPI    Interim history:     David Krueger is a 70 year old right-handed gentleman with an underlying medical history of allergic rhinitis, hyperlipidemia, and anemia, who presents for follow-up consultation of his Parkinson's disease with associated RBD and history of central sleep apnea.  He is accompanied by his wife again today.  I last saw him on 02/18/2021, at which time he reported doing fairly well, no recent falls, was trying to stay active, was playing golf on a regular basis and also participated in boxing classes 3 days out of the week.  He found these very beneficial.  His wife had noticed more forgetfulness.  He had undergone hernia surgery in January 2022.  He was advised to increase his Sinemet to 1 pill 4 times daily.  He was advised to continue with ropinirole.  He was advised to increase his melatonin to 6 mg daily.  Today, 08/20/2021: He reports overall doing well.  He still has intermittent dream and acting behavior, they do not typically sleep in the same bed together.  Wife heard him yell out recently.  He has not fallen.  He continues to stay active mentally and physically.  Sometimes he has forgetfulness but nothing sinister.  He is overall doing quite well, continues with Sinemet 1 pill 4 times a day and Requip long-acting once daily.  He continues to participate in boxing classes, voice has become just a little bit weaker at times but he tries to practice his projection, sometimes he sits in the car in the garage to practice his voice loudly without disturbing his wife.  He overall feels that he sleeps quite well, he does take melatonin 6 mg at bedtime.  He is not in favor of increasing this quite yet, he continues to play golf regularly, no issues with constipation, tries to hydrate well with water.   The patient's allergies, current medications, family history, past medical history, past social history, past  surgical history and problem list were reviewed and updated as appropriate.    Previously:  I saw him on 08/20/2020, at which time he reported feeling well.  He was active and enrolled in rock steady boxing for PD.  He was able to tolerate levodopa and was taking 1 pill 3 times daily.  He was on once daily long-acting ropinirole 8 mg strength as well.  He reported 1 recent fall without any major injuries thankfully, he fell while playing Frisbee with his grandson.  He was advised to continue with his current medication regimen and follow-up routinely in 6 months.       I saw him on 04/09/2020, at which time he reported lightheadedness upon standing up too quickly.  No recent falls.  He was on Requip long-acting 8 mg daily but had noticed an increase in his left hand tremor.  His wife noticed that he had become slower.  Cognitively he was stable.  He had stopped using his BiPAP in January 2021 and reported that he felt better off it.  He was advised to continue with long-acting ropinirole and start Sinemet.     I saw him on 10/16/2019, at which time he was compliant with his BiPAP.  He was able to tolerate Requip.  He wanted to start rock steady boxing for Parkinson's disease. He was advised to continue with Requip at the current dose.      I saw him on 06/15/2019, at  which time he was compliant with his BiPAP.  He was taking Requip 2 mg twice daily.  He had noticed changes in his voice.  He still had some dream enactment behavior.  His wife also noted that his left side tremor was a little worse.  I suggested we gradually increase the Requip.    Later in August, we increased his Requip to once daily long-acting 8 mg strength.    I reviewed his BiPAP compliance data from 09/17/2019 through 10/16/2019 which is a total of 30 days, during which time he used his machine with percent use days greater than 4 hours at nearly 90%, 87%, indicating excellent compliance with an average usage of 4 hours and 27  minutes, residual AHI at goal at 1.5/h, leak acceptable with the 95th percentile at 14 L/min with a pressure of 12/7 with a backup rate of 14.     I saw him on 02/09/2019 in virtual visit, at which time he reported Doing well, no dream enactment slightly, speech had improved since starting Requip.  He was tolerating it, was taking 2 mg twice daily.  He was physically active.  He did not have any recent constipation issues.  He was struggling a little bit with the BiPAP but overall was motivated to continue.  His wife felt that the Requip had made a significant difference.   I reviewed his BiPAP ST compliance data from 05/16/2019 through 06/14/2019 which is a total of 30 days, during which time he used his machine 26 nights, with percent used days greater than 4 hours at 73%, indicating adequate compliance with an average usage of 4 hours and 31 minutes, residual AHI at goal at 2.8/h, leak acceptable with a 95th percentile at 12.1 L/min on a pressure of 12/7 with a rate of 14.     I saw him on 12/06/2018, at which time we talked about his recent brain SPECT scan, DaT scan, from 12/01/2018 which showed decreased radiotracer activity within the posterior left and right striata. I suggested we start him on ropinirole with gradual increase and I made a referral to neuro rehabilitation. We also talked about utilizing coenzyme Q10 over-the-counter as a supplement. We also talked about his sleep study results.   He had had a baseline sleep study in December 2019 which showed central sleep apnea, he had a BiPAP titration study in January 2020. His second sleep study showed some changes in keeping with REM behavior disorder.   I reviewed his BiPAP compliance data from 01/03/2019 through 02/01/2019 which is a total of 30 days, during which time he used his BiPAP every night with percent used days greater than 4 hours at 87%, indicating very good compliance with an average usage of 4 hours and 36 minutes, residual AHI at  goal at 1.7 per hour, leak acceptable with the 95th percentile at 7.5 L/m on a pressure of 12/7 with a backup rate of 14.   I first met him on 09/21/2018 at the request of his primary care physician, at which time he reported changes in his gait and posture. He also reported a 3+ year history of vivid dreams and dream enactment behavior. His exam showed evidence of parkinsonism with left-sided lateralization. I suggested further workup for parkinsonism including a DaT scan, and sleep study testing.    He had a nuclear medicine DaT scan on 12/01/2018 and I reviewed the results: IMPRESSION: Decreased radiotracer activity (dopamine transporter populations) within the posterior LEFT and RIGHT striata (putamen) is  a pattern suggestive of Parkinson's syndrome pathology.   We called him with his test results.    His baseline sleep study in December 2019 showed moderate primary central sleep apnea. He was advised to return for a BiPAP titration study, which he had in January 2020. During the second sleep study also had some REM related increase in muscle tone and vocalization, in keeping with his history of REM behavior disorder.   09/21/2018: (He) has had some recent changes in his gait and posture. He has had an intermittent tremor, he does not notice his motor changes or posture changes himself but his kids have noticed it and wife has noticed it. He is more concerned about his sleep disturbance. His wife reports that for the past 3+ years he has had intermittent vivid dreams and erratic behaviors and dreams including movements, twitching, and even more dramatic movements such as grabbing her. She sometimes gets scared because of the unpredictable behaviors in his sleep. They still sleep in the same bed together. They have been married 8 years. She has noticed increase in snoring and sometimes apneic pauses while he is asleep. He sometimes reports vivid dreams to her including fighting with an animal but  typically does not have recollection of his dreams. He has a history of Parkinson's disease in paternal grandmother. His own mother lived to be 54 and had trembling or parkinsonism in her 66s. Of note, their son who is currently 67 was diagnosed with essential tremor when he was a child, maybe as young as 52 years old, no significant progression in the tremor and son has not been on any symptomatic treatment for this. Cognitively, patient is fine. He retired last year, he was a Chief Executive Officer. He is very active physically and socially. He likes to golf about 3 days out of the week. They have 3 children, son is the oldest, they have 1 daughter in [place] and one in [place], [place]; she has 2 kids, ages 67 and 12. I reviewed your office note from 09/09/2018, which you kindly included. His Epworth sleepiness score is 8 out of 24 today, fatigue score is 21 out of 63. He is particularly worried about his sleep disturbance. He is married and lives with his wife, he is a nonsmoker, retired, does not utilize alcohol or caffeine on a regular basis.     His Past Medical History Is Significant For: Past Medical History:  Diagnosis Date   Allergic rhinitis    Hypercholesterolemia     His Past Surgical History Is Significant For: Past Surgical History:  Procedure Laterality Date   VASECTOMY      His Family History Is Significant For: Family History  Problem Relation Age of Onset   Parkinson's disease Mother     His Social History Is Significant For: Social History   Socioeconomic History   Marital status: Married    Spouse name: Not on file   Number of children: Not on file   Years of education: Not on file   Highest education level: Not on file  Occupational History   Not on file  Tobacco Use   Smoking status: Never   Smokeless tobacco: Never  Vaping Use   Vaping Use: Never used  Substance and Sexual Activity   Alcohol use: Yes    Alcohol/week: 1.0 standard drink    Types: 1 Cans of beer per week    Drug use: Not on file   Sexual activity: Not on file  Other Topics Concern  Not on file  Social History Narrative   Not on file   Social Determinants of Health   Financial Resource Strain: Not on file  Food Insecurity: Not on file  Transportation Needs: Not on file  Physical Activity: Not on file  Stress: Not on file  Social Connections: Not on file    His Allergies Are:  No Known Allergies:   His Current Medications Are:  Outpatient Encounter Medications as of 08/20/2021  Medication Sig   carbidopa-levodopa (SINEMET IR) 25-100 MG tablet Take 1 tablet by mouth 4 (four) times daily. Take at 6 AM, 10 AM and 2 PM and 6 PM daily   fluticasone (FLONASE) 50 MCG/ACT nasal spray Place into both nostrils daily.   Melatonin 3 MG TABS Take 3 mg by mouth at bedtime.   Multiple Vitamins-Minerals (ICAPS AREDS 2 PO) Take by mouth.   Multiple Vitamins-Minerals (MULTIVITAMIN WITH MINERALS) tablet Take 1 tablet by mouth daily.   Omega-3 Fatty Acids (FISH OIL) 1000 MG CAPS Take by mouth.   rOPINIRole (REQUIP XL) 8 MG 24 hr tablet Take 1 tablet (8 mg total) by mouth at bedtime.   rosuvastatin (CRESTOR) 10 MG tablet Take 10 mg by mouth at bedtime.   [DISCONTINUED] niacin 250 MG tablet Take 250 mg by mouth 2 (two) times daily with a meal.   No facility-administered encounter medications on file as of 08/20/2021.  :  Review of Systems:  Out of a complete 14 point review of systems, all are reviewed and negative with the exception of these symptoms as listed below:   Review of Systems  Neurological:        Pt is here for follow up with  with Parkinson's. Pt states has Tremors in left hand and now left foot . Pt wife states he trips over things in the house.  Marland Kitchen Pt wife states he naps a lot .Pt states he yells in his sleep .    Objective:  Neurological Exam  Physical Exam  Physical Examination:   Vitals:   08/20/21 0950  BP: 114/71  Pulse: (!) 52    General Examination: The patient  is a very pleasant 70 y.o. male in no acute distress. He appears well-developed and well-nourished and well groomed.   HEENT: Normocephalic, atraumatic, pupils are equal, round and reactive to light and accommodation. Extraocular tracking is fairly well-preserved, hearing is grossly intact. Face is symmetric, with moderate facial masking. He has a very slight intermittent lower jaw and lip tremor. Speech is moderately hypophonic. Neck shows mild to moderate nuchal rigidity, stable. Tongue protrudes centrally in palate elevates symmetrically.   Chest: Clear to auscultation without wheezing, rhonchi or crackles noted.   Heart: S1+S2+0, regular and normal without murmurs, rubs or gallops noted.    Abdomen: Soft, non-tender and non-distended.   Extremities: There is no pitting edema in the distal lower extremities bilaterally.   Skin: Warm and dry without trophic changes noted.   Musculoskeletal: exam reveals no obvious joint deformities.    Neurologically:  Mental status: The patient is awake, alert and oriented in all 4 spheres. His immediate and remote memory, attention, language skills and fund of knowledge are appropriate. There is no evidence of aphasia, agnosia, apraxia or anomia. Speech is clear with normal prosody and enunciation. Thought process is linear. Mood is normal and affect is normal. Good spirits.   MMSE - Mini Mental State Exam 08/20/2021  Orientation to time 5  Orientation to Place 5  Registration 3  Attention/  Calculation 5  Recall 3  Language- name 2 objects 2  Language- repeat 1  Language- follow 3 step command 3  Language- read & follow direction 1  Write a sentence 1  Copy design 1  Total score 30    On 08/20/2021: CDT: 4/4, AFT: 20/min. Cranial nerves II - XII are as described above under HEENT exam. In addition: shoulder shrug is unequal with left shoulder slightly higher than right, seems stable.   Motor exam: Normal bulk, strength and tone with the  exception of mild cogwheeling in the left upper extremity, slight cogwheeling in the right upper extremity also. He has a fairly consistent resting tremor in the left upper extremity, mild and intermittent resting tremor in the right upper extremity.  Slight tremor in the left lower extremity noted today.  No obvious dyskinesias.  Overall mild slowness.     (On 09/21/2018:on Archimedes spiral drawing he has no significant difficulty with his right hand which is his dominant hand, he has mild insecurity but no obvious trembling with the left hand, handwriting is legible, not particularly tremulous, but on the smaller side.)   He has no significant postural or action tremor, no intention tremor. On fine motor testing he has mild difficulty on the right, mild to moderate difficulty on the left noted with finger taps and foot taps.  Overall mild slowness.  Cerebellar testing: No dysmetria or intention tremor on finger to nose testing. There is no truncal or gait ataxia.  Sensory exam: intact to light touch in the upper and lower extremities.  Gait, station and balance: He stands without difficulty and posture is mildly stooped for age, stable. He walks with fairly good stride length and pace, but decrease in arm swing on the left. He turns without problems, balance is preserved.   Assessment and Plan:    In summary, David Krueger is a very pleasant 70 year old male with an underlying medical history of allergic rhinitis, hyperlipidemia, and anemia, who presents for follow-up consultation of his left-sided predominant Parkinson's disease, associated with sleep disordered breathing including central sleep apnea (no longer on BiPAP), REM behavior disorder, and mild (subjective) forgetfulness.  Memory scores look good today.  He has been on melatonin which had been helpful, and he was advised to increase from 3 mg to 6 mg in 04/22.  He can increase this further to 10 mg at night if need be.  He is not quite  ready for this.  He feels that he is doing well with regards to sleeping overall.  Unfortunately, he could not tolerate BiPAP and eventually stopped using the machine in January 2021, felt better without it.  Of note, he had a DaTscan on 12/01/2018 which showed findings supportive of parkinsonian syndrome. He has an approx. 5 to 6+ year history of sleep disturbance including dream enactment behavior. Motor symptoms date back to about 3 to 4 years ago. History and examination are in keeping with left-sided predominant Parkinson's disease. His history and prior sleep study results were supportive of REM behavior disorder as well. He was found to have central sleep apnea and has started BiPAP therapy for this in January 2020. He was compliant with his BiPAP ST, but stopped using BiPAP in January 2021. He has been on Requip for his Parkinson's disease and we increased it to 6 mg daily in August 2020 and then further to 8 mg which he continues to take.  He does have some daytime somnolence and may take a  nap.  We started Sinemet with gradual titration in June 2021.  He has benefited from it and tolerates it.  He is very active physically, continues to participate in [place] steady boxing in [place] and has greatly benefited from it.  He also plays golf regularly.  We mutually agreed to continue with Sinemet 1 pill 4 times a day, we increase this to 1 pill 4 times a day in April 2022.  He had hernia surgery in January 2022 with bilateral repair and a follow-up appointment in February 2022. He has had some lightheadedness upon standing and blood pressure tends to be on the lower end of normal, but stable for now.  He has had some interim increase in his right hand tremor and an intermittent left foot tremor.  We will maintain current medications.  He did not need a refill quite yet.  We will continue to monitor his memory but he performed well on memory testing today.  We will plan to follow-up routinely in 6 months, sooner  if needed.  They are encouraged to stay in touch via MyChart messaging if anything else comes up in the interim. I answered all the questions today and the patient and his wife were in agreement. I spent 40 minutes in total face-to-face time and in reviewing records during pre-charting, more than 50% of which was spent in counseling and coordination of care, reviewing test results, reviewing medications and treatment regimen and/or in discussing or reviewing the diagnosis of PD, the prognosis and treatment options. Pertinent laboratory and imaging test results that were available during this visit with the patient were reviewed by me and considered in my medical decision making (see chart for details).

## 2021-09-15 DIAGNOSIS — J209 Acute bronchitis, unspecified: Secondary | ICD-10-CM | POA: Diagnosis not present

## 2021-09-15 DIAGNOSIS — R051 Acute cough: Secondary | ICD-10-CM | POA: Diagnosis not present

## 2021-09-15 DIAGNOSIS — Z20828 Contact with and (suspected) exposure to other viral communicable diseases: Secondary | ICD-10-CM | POA: Diagnosis not present

## 2021-09-22 DIAGNOSIS — J329 Chronic sinusitis, unspecified: Secondary | ICD-10-CM | POA: Diagnosis not present

## 2021-09-22 DIAGNOSIS — J40 Bronchitis, not specified as acute or chronic: Secondary | ICD-10-CM | POA: Diagnosis not present

## 2021-12-10 DIAGNOSIS — R0981 Nasal congestion: Secondary | ICD-10-CM | POA: Diagnosis not present

## 2022-02-05 DIAGNOSIS — Z0000 Encounter for general adult medical examination without abnormal findings: Secondary | ICD-10-CM | POA: Diagnosis not present

## 2022-02-05 DIAGNOSIS — Z1331 Encounter for screening for depression: Secondary | ICD-10-CM | POA: Diagnosis not present

## 2022-02-05 DIAGNOSIS — Z6824 Body mass index (BMI) 24.0-24.9, adult: Secondary | ICD-10-CM | POA: Diagnosis not present

## 2022-02-05 DIAGNOSIS — Z125 Encounter for screening for malignant neoplasm of prostate: Secondary | ICD-10-CM | POA: Diagnosis not present

## 2022-02-05 DIAGNOSIS — E7800 Pure hypercholesterolemia, unspecified: Secondary | ICD-10-CM | POA: Diagnosis not present

## 2022-02-05 DIAGNOSIS — Z79899 Other long term (current) drug therapy: Secondary | ICD-10-CM | POA: Diagnosis not present

## 2022-02-05 DIAGNOSIS — G20 Parkinson's disease: Secondary | ICD-10-CM | POA: Diagnosis not present

## 2022-02-18 ENCOUNTER — Encounter: Payer: Self-pay | Admitting: Neurology

## 2022-02-18 ENCOUNTER — Ambulatory Visit: Payer: PPO | Admitting: Neurology

## 2022-02-18 VITALS — BP 100/68 | HR 69 | Ht 68.0 in | Wt 160.2 lb

## 2022-02-18 DIAGNOSIS — R498 Other voice and resonance disorders: Secondary | ICD-10-CM

## 2022-02-18 DIAGNOSIS — G4752 REM sleep behavior disorder: Secondary | ICD-10-CM

## 2022-02-18 DIAGNOSIS — G20 Parkinson's disease: Secondary | ICD-10-CM

## 2022-02-18 MED ORDER — CARBIDOPA-LEVODOPA 25-100 MG PO TABS: 1.5000 | ORAL_TABLET | Freq: Four times a day (QID) | ORAL | 3 refills | Status: DC

## 2022-02-18 MED ORDER — ROPINIROLE HCL ER 8 MG PO TB24: 8.0000 mg | ORAL_TABLET | Freq: Every day | ORAL | 3 refills | Status: DC

## 2022-02-18 NOTE — Progress Notes (Signed)
Subjective:  ?  ?Patient ID: David Krueger is a 71 y.o. male. ? ?HPI ? ? ? ?Interim history:  ? ?[name] is a 71 year old right-handed gentleman with an underlying medical history of allergic rhinitis, hyperlipidemia, and anemia, who presents for follow-up consultation of his Parkinson's disease with associated RBD and history of central sleep apnea.  He is unaccompanied today. I last saw him on 08/20/2021, at which time he was reported to have more active dreams.  He was advised to take melatonin consistently.  He was advised to continue with Sinemet 1 pill 4 times a day and Requip long-acting once daily.  He was physically active and participated in boxing classes.  He reported more difficulty with his voice. ?Today, 02/18/22: He reports that his wife who usually is here for his appointment is sick with an acute illness including cough.  He does not have any similar symptoms thankfully.  He reports that his voice has become weaker and more raspy.  He denies any choking incidences, no significant constipation.  He has had more tremor.  His hearing has become worse and he is considering formal evaluation.  He takes the Requip XL 8 mg once daily but it is expensive, he still wants to maintain taking it but would not be opposed to considering something different.  He has been taking melatonin at 6 mg each night and feels that he sleeps fairly well.  He has had no recent falls.  He has had lightheadedness when he stood up too quickly especially after bending down.  He is mindful to change positions slowly.  He tries to hydrate well with water. ? ?The patient's allergies, current medications, family history, past medical history, past social history, past surgical history and problem list were reviewed and updated as appropriate.  ?  ?Previously: ? ? ?I saw him on 02/18/2021, at which time he reported doing fairly well, no recent falls, was trying to stay active, was playing golf on a regular basis and also  participated in boxing classes 3 days out of the week.  He found these very beneficial.  His wife had noticed more forgetfulness.  He had undergone hernia surgery in January 2022.  He was advised to increase his Sinemet to 1 pill 4 times daily.  He was advised to continue with ropinirole.  He was advised to increase his melatonin to 6 mg daily. ?  ?  ?I saw him on 08/20/2020, at which time he reported feeling well.  He was active and enrolled in rock steady boxing for PD.  He was able to tolerate levodopa and was taking 1 pill 3 times daily.  He was on once daily long-acting ropinirole 8 mg strength as well.  He reported 1 recent fall without any major injuries thankfully, he fell while playing Frisbee with his grandson.  He was advised to continue with his current medication regimen and follow-up routinely in 6 months. ?  ?  ?  ?I saw him on 04/09/2020, at which time he reported lightheadedness upon standing up too quickly.  No recent falls.  He was on Requip long-acting 8 mg daily but had noticed an increase in his left hand tremor.  His wife noticed that he had become slower.  Cognitively he was stable.  He had stopped using his BiPAP in January 2021 and reported that he felt better off it.  He was advised to continue with long-acting ropinirole and start Sinemet. ?  ?  ?I saw him on  10/16/2019, at which time he was compliant with his BiPAP.  He was able to tolerate Requip.  He wanted to start rock steady boxing for Parkinson's disease. He was advised to continue with Requip at the current dose.  ?  ?  ?I saw him on 06/15/2019, at which time he was compliant with his BiPAP.  He was taking Requip 2 mg twice daily.  He had noticed changes in his voice.  He still had some dream enactment behavior.  His wife also noted that his left side tremor was a little worse.  I suggested we gradually increase the Requip.  ?  ?Later in August, we increased his Requip to once daily long-acting 8 mg strength.  ?  ?I reviewed his BiPAP  compliance data from 09/17/2019 through 10/16/2019 which is a total of 30 days, during which time he used his machine with percent use days greater than 4 hours at nearly 90%, 87%, indicating excellent compliance with an average usage of 4 hours and 27 minutes, residual AHI at goal at 1.5/h, leak acceptable with the 95th percentile at 14 L/min with a pressure of 12/7 with a backup rate of 14.   ?  ?I saw him on 02/09/2019 in virtual visit, at which time he reported Doing well, no dream enactment slightly, speech had improved since starting Requip.  He was tolerating it, was taking 2 mg twice daily.  He was physically active.  He did not have any recent constipation issues.  He was struggling a little bit with the BiPAP but overall was motivated to continue.  His wife felt that the Requip had made a significant difference. ?  ?I reviewed his BiPAP ST compliance data from 05/16/2019 through 06/14/2019 which is a total of 30 days, during which time he used his machine 26 nights, with percent used days greater than 4 hours at 73%, indicating adequate compliance with an average usage of 4 hours and 31 minutes, residual AHI at goal at 2.8/h, leak acceptable with a 95th percentile at 12.1 L/min on a pressure of 12/7 with a rate of 14.   ?  ?I saw him on 12/06/2018, at which time we talked about his recent brain SPECT scan, DaT scan, from 12/01/2018 which showed decreased radiotracer activity within the posterior left and right striata. I suggested we start him on ropinirole with gradual increase and I made a referral to neuro rehabilitation. We also talked about utilizing coenzyme Q10 over-the-counter as a supplement. We also talked about his sleep study results. ?  ?He had had a baseline sleep study in December 2019 which showed central sleep apnea, he had a BiPAP titration study in January 2020. His second sleep study showed some changes in keeping with REM behavior disorder. ?  ?I reviewed his BiPAP compliance data from  01/03/2019 through 02/01/2019 which is a total of 30 days, during which time he used his BiPAP every night with percent used days greater than 4 hours at 87%, indicating very good compliance with an average usage of 4 hours and 36 minutes, residual AHI at goal at 1.7 per hour, leak acceptable with the 95th percentile at 7.5 L/m on a pressure of 12/7 with a backup rate of 14. ?  ?I first met him on 09/21/2018 at the request of his primary care physician, at which time he reported changes in his gait and posture. He also reported a 3+ year history of vivid dreams and dream enactment behavior. His exam showed evidence of parkinsonism  with left-sided lateralization. I suggested further workup for parkinsonism including a DaT scan, and sleep study testing.  ?  ?He had a nuclear medicine DaT scan on 12/01/2018 and I reviewed the results: IMPRESSION: ?Decreased radiotracer activity (dopamine transporter populations) ?within the posterior LEFT and RIGHT striata (putamen) is a pattern ?suggestive of Parkinson's syndrome pathology. ?  ?We called him with his test results.  ?  ?His baseline sleep study in December 2019 showed moderate primary central sleep apnea. He was advised to return for a BiPAP titration study, which he had in January 2020. During the second sleep study also had some REM related increase in muscle tone and vocalization, in keeping with his history of REM behavior disorder. ?  ?09/21/2018: (He) has had some recent changes in his gait and posture. He has had an intermittent tremor, he does not notice his motor changes or posture changes himself but his kids have noticed it and wife has noticed it. He is more concerned about his sleep disturbance. His wife reports that for the past 3+ years he has had intermittent vivid dreams and erratic behaviors and dreams including movements, twitching, and even more dramatic movements such as grabbing her. She sometimes gets scared because of the unpredictable behaviors  in his sleep. They still sleep in the same bed together. They have been married 21 years. She has noticed increase in snoring and sometimes apneic pauses while he is asleep. He sometimes reports vivid dreams to

## 2022-02-18 NOTE — Patient Instructions (Addendum)
It was nice to see you again today.  As discussed, I would like for you to increase your carbidopa-levodopa 25-100 mg strength to 1-1/2 pills 4 times a day.  I adjusted your prescription.  We will maintain you on ropinirole long-acting 8 mg once daily. ?Please follow up routinely in 6 months, sooner if needed. You may benefit from getting evaluated for hearing loss ?

## 2022-02-27 DIAGNOSIS — H6123 Impacted cerumen, bilateral: Secondary | ICD-10-CM | POA: Diagnosis not present

## 2022-05-14 DIAGNOSIS — H61303 Acquired stenosis of external ear canal, unspecified, bilateral: Secondary | ICD-10-CM | POA: Diagnosis not present

## 2022-05-14 DIAGNOSIS — H9190 Unspecified hearing loss, unspecified ear: Secondary | ICD-10-CM | POA: Diagnosis not present

## 2022-05-14 DIAGNOSIS — H903 Sensorineural hearing loss, bilateral: Secondary | ICD-10-CM | POA: Diagnosis not present

## 2022-06-02 ENCOUNTER — Telehealth: Payer: Self-pay | Admitting: Neurology

## 2022-06-02 MED ORDER — ROPINIROLE HCL 3 MG PO TABS: 3.0000 mg | ORAL_TABLET | Freq: Three times a day (TID) | ORAL | 3 refills | Status: DC

## 2022-06-02 NOTE — Addendum Note (Signed)
Addended by: Guy Begin on: 06/02/2022 05:14 PM   Modules accepted: Orders

## 2022-06-02 NOTE — Telephone Encounter (Signed)
We can switch him from once daily long-acting ropinirole 8 mg strength to ropinirole immediate release generic 3 mg 3 times daily.  If he is agreeable to switching to the generic immediate release version, please send in prescription for 90 days with 3 refills to his pharmacy of choice.

## 2022-06-02 NOTE — Telephone Encounter (Signed)
Pt said rOPINIRole (REQUIP XL) 8 MG 24 hr tablet is expensive, $100 a month. Pharmacy said there may be a cheaper medication or generic brand. Would like a call from the nurse.

## 2022-06-02 NOTE — Telephone Encounter (Signed)
I called pt and he states that the [facility] pharmacy that he uses and wants to continue to use , it costs $76.31 - $100.00 / month for the Ropinirole ER 8mg  tablets.  He is asking if other option for him that is more cost effective for him.  (I relayed about good rx but he wanted to stay with his pharmacy.

## 2022-06-02 NOTE — Telephone Encounter (Signed)
Spoke to pt and relayed that Dr. Frances Furbish is ok to change to generic ropinirole 3mg  IR tablets.  Take 1 table po TID.  Pt verbalized understanding. He will use what he has left then will get that new prescription.  He appreciated call back. Order placed.

## 2022-06-23 DIAGNOSIS — M25551 Pain in right hip: Secondary | ICD-10-CM | POA: Diagnosis not present

## 2022-06-23 DIAGNOSIS — M545 Low back pain, unspecified: Secondary | ICD-10-CM | POA: Diagnosis not present

## 2022-07-09 DIAGNOSIS — R42 Dizziness and giddiness: Secondary | ICD-10-CM | POA: Diagnosis not present

## 2022-07-09 DIAGNOSIS — R55 Syncope and collapse: Secondary | ICD-10-CM | POA: Diagnosis not present

## 2022-07-09 DIAGNOSIS — G20 Parkinson's disease: Secondary | ICD-10-CM | POA: Diagnosis not present

## 2022-07-09 DIAGNOSIS — I951 Orthostatic hypotension: Secondary | ICD-10-CM | POA: Diagnosis not present

## 2022-07-15 DIAGNOSIS — R42 Dizziness and giddiness: Secondary | ICD-10-CM | POA: Diagnosis not present

## 2022-07-15 DIAGNOSIS — G20 Parkinson's disease: Secondary | ICD-10-CM | POA: Diagnosis not present

## 2022-08-20 ENCOUNTER — Encounter: Payer: Self-pay | Admitting: Neurology

## 2022-08-20 ENCOUNTER — Ambulatory Visit: Payer: PPO | Admitting: Neurology

## 2022-08-20 VITALS — BP 127/80 | HR 61 | Ht 68.0 in | Wt 157.8 lb

## 2022-08-20 DIAGNOSIS — G20A2 Parkinson's disease without dyskinesia, with fluctuations: Secondary | ICD-10-CM | POA: Diagnosis not present

## 2022-08-20 NOTE — Progress Notes (Signed)
Subjective:    Patient ID: David Krueger is a 71 y.o. male.  HPI    Interim history:   David Krueger is a 71 year old right-handed gentleman with an underlying medical history of allergic rhinitis, hyperlipidemia, and anemia, who presents for follow-up consultation of his Parkinson's disease with associated RBD and history of central sleep apnea.  He is accompanied by his wife today. I last saw him on 02/18/22, at which time he reported that his voice has become weaker and more raspy.  He had no issues with swallowing.  He denied any issues with constipation.  He had noticed worsening tremor.  He was on once daily long-acting ropinirole 8 mg strength.  He was advised to increase his carbidopa-levodopa to 1-1/2 pills 4 times a day.  He was advised to continue with ropinirole long-acting 8 mg once daily.  Patient called in late July 2023 reporting that his prescription coverage for ropinirole long-acting had changed and it has become very expensive.  I suggested that we switch him to ropinirole IR  3 mg 3 times daily.    Today, 08/20/2022: He reports doing fairly well, tolerating levodopa 1-1/2 pills 4 times daily and successfully transition to ropinirole 3 mg 3 times daily without major change in symptoms.  He has not fallen but has had more hip pain, sometimes more on the left and sometimes more on the right.  He has seen an orthopedic specialist in [place].  Unfortunately, his wife took a fall and fractured her left femur in June 2023 and needed surgery.  She is recuperating well, she will need right hand surgery for arthritis in November.  He is very active, continues to go to rock steady boxing 3 days a week and also plays golf regularly.  No significant issues with constipation, no issues with cognitive function.   The patient's allergies, current medications, family history, past medical history, past social history, past surgical history and problem list were reviewed and updated as  appropriate.    Previously:     I saw him on 08/20/2021, at which time he was reported to have more active dreams.  He was advised to take melatonin consistently.  He was advised to continue with Sinemet 1 pill 4 times a day and Requip long-acting once daily.  He was physically active and participated in boxing classes.  He reported more difficulty with his voice.   I saw him on 02/18/2021, at which time he reported doing fairly well, no recent falls, was trying to stay active, was playing golf on a regular basis and also participated in boxing classes 3 days out of the week.  He found these very beneficial.  His wife had noticed more forgetfulness.  He had undergone hernia surgery in January 2022.  He was advised to increase his Sinemet to 1 pill 4 times daily.  He was advised to continue with ropinirole.  He was advised to increase his melatonin to 6 mg daily.     I saw him on 08/20/2020, at which time he reported feeling well.  He was active and enrolled in rock steady boxing for PD.  He was able to tolerate levodopa and was taking 1 pill 3 times daily.  He was on once daily long-acting ropinirole 8 mg strength as well.  He reported 1 recent fall without any major injuries thankfully, he fell while playing Frisbee with his grandson.  He was advised to continue with his current medication regimen and follow-up routinely in 6 months.  I saw him on 04/09/2020, at which time he reported lightheadedness upon standing up too quickly.  No recent falls.  He was on Requip long-acting 8 mg daily but had noticed an increase in his left hand tremor.  His wife noticed that he had become slower.  Cognitively he was stable.  He had stopped using his BiPAP in January 2021 and reported that he felt better off it.  He was advised to continue with long-acting ropinirole and start Sinemet.     I saw him on 10/16/2019, at which time he was compliant with his BiPAP.  He was able to tolerate Requip.  He wanted to  start rock steady boxing for Parkinson's disease. He was advised to continue with Requip at the current dose.      I saw him on 06/15/2019, at which time he was compliant with his BiPAP.  He was taking Requip 2 mg twice daily.  He had noticed changes in his voice.  He still had some dream enactment behavior.  His wife also noted that his left side tremor was a little worse.  I suggested we gradually increase the Requip.    Later in August, we increased his Requip to once daily long-acting 8 mg strength.    I reviewed his BiPAP compliance data from 09/17/2019 through 10/16/2019 which is a total of 30 days, during which time he used his machine with percent use days greater than 4 hours at nearly 90%, 87%, indicating excellent compliance with an average usage of 4 hours and 27 minutes, residual AHI at goal at 1.5/h, leak acceptable with the 95th percentile at 14 L/min with a pressure of 12/7 with a backup rate of 14.     I saw him on 02/09/2019 in virtual visit, at which time he reported Doing well, no dream enactment slightly, speech had improved since starting Requip.  He was tolerating it, was taking 2 mg twice daily.  He was physically active.  He did not have any recent constipation issues.  He was struggling a little bit with the BiPAP but overall was motivated to continue.  His wife felt that the Requip had made a significant difference.   I reviewed his BiPAP ST compliance data from 05/16/2019 through 06/14/2019 which is a total of 30 days, during which time he used his machine 26 nights, with percent used days greater than 4 hours at 73%, indicating adequate compliance with an average usage of 4 hours and 31 minutes, residual AHI at goal at 2.8/h, leak acceptable with a 95th percentile at 12.1 L/min on a pressure of 12/7 with a rate of 14.     I saw him on 12/06/2018, at which time we talked about his recent brain SPECT scan, DaT scan, from 12/01/2018 which showed decreased radiotracer activity within the  posterior left and right striata. I suggested we start him on ropinirole with gradual increase and I made a referral to neuro rehabilitation. We also talked about utilizing coenzyme Q10 over-the-counter as a supplement. We also talked about his sleep study results.   He had had a baseline sleep study in December 2019 which showed central sleep apnea, he had a BiPAP titration study in January 2020. His second sleep study showed some changes in keeping with REM behavior disorder.   I reviewed his BiPAP compliance data from 01/03/2019 through 02/01/2019 which is a total of 30 days, during which time he used his BiPAP every night with percent used days greater than 4 hours  at 87%, indicating very good compliance with an average usage of 4 hours and 36 minutes, residual AHI at goal at 1.7 per hour, leak acceptable with the 95th percentile at 7.5 L/m on a pressure of 12/7 with a backup rate of 14.   I first met him on 09/21/2018 at the request of his primary care physician, at which time he reported changes in his gait and posture. He also reported a 3+ year history of vivid dreams and dream enactment behavior. His exam showed evidence of parkinsonism with left-sided lateralization. I suggested further workup for parkinsonism including a DaT scan, and sleep study testing.    He had a nuclear medicine DaT scan on 12/01/2018 and I reviewed the results: IMPRESSION: Decreased radiotracer activity (dopamine transporter populations) within the posterior LEFT and RIGHT striata (putamen) is a pattern suggestive of Parkinson's syndrome pathology.   We called him with his test results.    His baseline sleep study in December 2019 showed moderate primary central sleep apnea. He was advised to return for a BiPAP titration study, which he had in January 2020. During the second sleep study also had some REM related increase in muscle tone and vocalization, in keeping with his history of REM behavior disorder.    09/21/2018: (He) has had some recent changes in his gait and posture. He has had an intermittent tremor, he does not notice his motor changes or posture changes himself but his kids have noticed it and wife has noticed it. He is more concerned about his sleep disturbance. His wife reports that for the past 3+ years he has had intermittent vivid dreams and erratic behaviors and dreams including movements, twitching, and even more dramatic movements such as grabbing her. She sometimes gets scared because of the unpredictable behaviors in his sleep. They still sleep in the same bed together. They have been married 86 years. She has noticed increase in snoring and sometimes apneic pauses while he is asleep. He sometimes reports vivid dreams to her including fighting with an animal but typically does not have recollection of his dreams. He has a history of Parkinson's disease in paternal grandmother. His own mother lived to be 87 and had trembling or parkinsonism in her 69s. Of note, their son who is currently 41 was diagnosed with essential tremor when he was a child, maybe as young as 67 years old, no significant progression in the tremor and son has not been on any symptomatic treatment for this. Cognitively, patient is fine. He retired last year, he was a Chief Executive Officer. He is very active physically and socially. He likes to golf about 3 days out of the week. They have 3 children, son is the oldest, they have 1 daughter in [place] and one in [place], [place]; she has 2 kids, ages 35 and 75. I reviewed your office note from 09/09/2018, which you kindly included. His Epworth sleepiness score is 8 out of 24 today, fatigue score is 21 out of 63. He is particularly worried about his sleep disturbance. He is married and lives with his wife, he is a nonsmoker, retired, does not utilize alcohol or caffeine on a regular basis.   His Past Medical History Is Significant For: Past Medical History:  Diagnosis Date   Allergic rhinitis     Back pain    lumbar   Hearing loss    Hypercholesterolemia     His Past Surgical History Is Significant For: Past Surgical History:  Procedure Laterality Date   VASECTOMY  His Family History Is Significant For: Family History  Problem Relation Age of Onset   Parkinson's disease Mother     His Social History Is Significant For: Social History   Socioeconomic History   Marital status: Married    Spouse name: Not on file   Number of children: Not on file   Years of education: Not on file   Highest education level: Not on file  Occupational History   Not on file  Tobacco Use   Smoking status: Never   Smokeless tobacco: Never  Vaping Use   Vaping Use: Never used  Substance and Sexual Activity   Alcohol use: Yes    Alcohol/week: 1.0 standard drink of alcohol    Types: 1 Cans of beer per week   Drug use: Not Currently   Sexual activity: Not on file  Other Topics Concern   Not on file  Social History Narrative   Not on file   Social Determinants of Health   Financial Resource Strain: Not on file  Food Insecurity: Not on file  Transportation Needs: Not on file  Physical Activity: Not on file  Stress: Not on file  Social Connections: Not on file    His Allergies Are:  No Known Allergies:   His Current Medications Are:  Outpatient Encounter Medications as of 08/20/2022  Medication Sig   carbidopa-levodopa (SINEMET IR) 25-100 MG tablet Take 1.5 tablets by mouth 4 (four) times daily. Take at 6 AM, 10 AM and 2 PM and 6 PM daily   fluticasone (FLONASE) 50 MCG/ACT nasal spray Place into both nostrils daily.   Melatonin 3 MG TABS Take 6 mg by mouth at bedtime.   Multiple Vitamins-Minerals (ICAPS AREDS 2 PO) Take by mouth.   Multiple Vitamins-Minerals (MULTIVITAMIN WITH MINERALS) tablet Take 1 tablet by mouth daily.   Omega-3 Fatty Acids (FISH OIL) 1000 MG CAPS Take by mouth.   rOPINIRole (REQUIP) 3 MG tablet Take 1 tablet (3 mg total) by mouth in the morning,  at noon, and at bedtime.   rosuvastatin (CRESTOR) 10 MG tablet Take 10 mg by mouth at bedtime.   No facility-administered encounter medications on file as of 08/20/2022.  :  Review of Systems:  Out of a complete 14 point review of systems, all are reviewed and negative with the exception of these symptoms as listed below:   Review of Systems  Neurological:        Follow up PD. Doing well.     Objective:  Neurological Exam  Physical Exam Physical Examination:   Vitals:   08/20/22 0947  BP: 127/80  Pulse: 61    General Examination: The patient is a very pleasant 71 y.o. male in no acute distress. He appears well-developed and well-nourished and well groomed.   HEENT: Normocephalic, atraumatic, pupils are equal, round and reactive to light, tracking mildly impaired, corrective eyeglasses in place.  Hearing grossly intact, hearing aids in place bilaterally.  Status post cataract repairs bilaterally.  Hearing is grossly intact, face is symmetric with moderate facial masking, intermittently lower jaw tremor noted, moderate nuchal rigidity.  Tongue protrudes centrally and palate elevates symmetrically.  Speech without dysarthria but he does have a raspy voice and moderate hypophonia.   Chest: Clear to auscultation without wheezing, rhonchi or crackles noted.   Heart: S1+S2+0, regular and normal without murmurs, rubs or gallops noted.    Abdomen: Soft, non-tender and non-distended.   Extremities: There is no pitting edema in the distal lower extremities bilaterally.  Skin: Warm and dry without trophic changes noted.   Musculoskeletal: exam reveals no obvious joint deformities.    Neurologically:  Mental status: The patient is awake, alert and oriented in all 4 spheres. His immediate and remote memory, attention, language skills and fund of knowledge are appropriate. There is no evidence of aphasia, agnosia, apraxia or anomia. Speech with mild raspiness and mild to moderate  hypophonia. Thought process is linear. Mood is normal and affect is normal. Good spirits.    MMSE - Mini Mental State Exam 08/20/2021  Orientation to time 5  Orientation to Place 5  Registration 3  Attention/ Calculation 5  Recall 3  Language- name 2 objects 2  Language- repeat 1  Language- follow 3 step command 3  Language- read & follow direction 1  Write a sentence 1  Copy design 1  Total score 30      On 08/20/2021: CDT: 4/4, AFT: 20/min. Cranial nerves II - XII are as described above under HEENT exam. In addition: shoulder shrug is unequal with left shoulder slightly higher than right, seems stable.   Motor exam: Normal bulk, strength and tone with the exception of mild cogwheeling in the left upper extremity, slight cogwheeling in the right upper extremity also. He has a fairly consistent resting tremor in the left upper extremity, mild and intermittent resting tremor in the right upper extremity.  Slight tremor in the left lower extremity noted today. No obvious dyskinesias.  Overall mild slowness.     (On 09/21/2018:on Archimedes spiral drawing he has no significant difficulty with his right hand which is his dominant hand, he has mild insecurity but no obvious trembling with the left hand, handwriting is legible, not particularly tremulous, but on the smaller side.)   He has no significant postural or action tremor, no intention tremor. On fine motor testing he has mild difficulty on the right, and more moderate findings on the left.  Mild bradykinesia.  Cerebellar testing: No dysmetria or intention tremor on finger to nose testing. There is no truncal or gait ataxia.  Sensory exam: intact to light touch in the upper and lower extremities.  Gait, station and balance: He stands without difficulty and posture is mildly stooped for age, slightly worse.  He walks with fairly good stride length, decreased pace and decreased arm swing on the left more than right.  Slight insecurity with  turns.    Assessment and Plan:    In summary, David Krueger is a very pleasant 71 year old male with an underlying medical history of allergic rhinitis, hyperlipidemia, and anemia, who presents for follow-up consultation of his left-sided predominant Parkinson's disease, associated with sleep disordered breathing including central sleep apnea (could not tolerate BiPAP), REM behavior disorder, and mild (subjective) forgetfulness.  Memory scores were good in October 2022.he has been on melatonin for sleep.  He is currently on Sinemet 1-1/2 pills 4 times daily, we switched him from ropinirole long-acting 8 mg once daily to 3 mg 3 times daily in July 2023 due to cost.   He could not tolerate BiPAP and eventually stopped using the machine in January 2021, felt better without it. He had a DaTscan on 12/01/2018 which showed findings supportive of an underlying parkinsonian syndrome. He has an approx. 7+ year history of sleep disturbance including dream enactment behavior. Motor symptoms date back to about 5 years ago. History and examination are in keeping with left-sided predominant Parkinson's disease. His history and prior sleep study results were supportive of  REM behavior disorder as well. He was found to have central sleep apnea and has started BiPAP therapy for this in January 2020. He was compliant with his BiPAP ST, but stopped using BiPAP in January 2021. He has been on Requip for his Parkinson's disease and we increased it to 6 mg daily in August 2020 and then further to 8 mg which we then switched to generic immediate release 30 mg 3 times daily in July 2023.  We will continue with his current medication regimen.  He is very active, continues to play golf, and also goes to rock steady boxing 3 days out of the week typically.   We increased his levodopa to 1 pill 4 times a day in April 2022. He had hernia surgery in January 2022 with bilateral repair and a follow-up appointment in February 2022. We will  plan to follow-up routinely in 6 months, sooner if needed.  I answered all their questions today to the patient and his wife were in agreement.   I spent 30 minutes in total face-to-face time and in reviewing records during pre-charting, more than 50% of which was spent in counseling and coordination of care, reviewing test results, reviewing medications and treatment regimen and/or in discussing or reviewing the diagnosis of PD, the prognosis and treatment options. Pertinent laboratory and imaging test results that were available during this visit with the patient were reviewed by me and considered in my medical decision making (see chart for details).

## 2022-12-29 ENCOUNTER — Telehealth: Payer: Self-pay | Admitting: Neurology

## 2022-12-29 NOTE — Telephone Encounter (Signed)
LVM and sent mychart msg informing pt of need to reschedule 02/18/23 appointment - MD out

## 2023-02-01 DIAGNOSIS — H61303 Acquired stenosis of external ear canal, unspecified, bilateral: Secondary | ICD-10-CM | POA: Diagnosis not present

## 2023-02-01 DIAGNOSIS — H6123 Impacted cerumen, bilateral: Secondary | ICD-10-CM | POA: Diagnosis not present

## 2023-02-01 DIAGNOSIS — H9193 Unspecified hearing loss, bilateral: Secondary | ICD-10-CM | POA: Diagnosis not present

## 2023-02-18 ENCOUNTER — Ambulatory Visit: Payer: PPO | Admitting: Neurology

## 2023-04-01 ENCOUNTER — Encounter: Payer: Self-pay | Admitting: Neurology

## 2023-04-01 ENCOUNTER — Ambulatory Visit: Payer: PPO | Admitting: Neurology

## 2023-04-01 VITALS — BP 93/62 | HR 58 | Ht 68.0 in | Wt 158.0 lb

## 2023-04-01 DIAGNOSIS — R6889 Other general symptoms and signs: Secondary | ICD-10-CM | POA: Diagnosis not present

## 2023-04-01 DIAGNOSIS — G20A2 Parkinson's disease without dyskinesia, with fluctuations: Secondary | ICD-10-CM | POA: Diagnosis not present

## 2023-04-01 DIAGNOSIS — R413 Other amnesia: Secondary | ICD-10-CM | POA: Diagnosis not present

## 2023-04-01 MED ORDER — ROPINIROLE HCL 3 MG PO TABS: 3.0000 mg | ORAL_TABLET | Freq: Three times a day (TID) | ORAL | 3 refills | Status: DC

## 2023-04-01 MED ORDER — CARBIDOPA-LEVODOPA 25-100 MG PO TABS: 1.5000 | ORAL_TABLET | Freq: Four times a day (QID) | ORAL | 3 refills | Status: DC

## 2023-04-01 NOTE — Progress Notes (Signed)
Subjective:    Patient ID: David Krueger is a 72 y.o. male.  HPI    Interim history:   David Krueger is a 72 year old right-handed gentleman with an underlying medical history of allergic rhinitis, hyperlipidemia, and anemia, who presents for follow-up consultation of his Parkinson's disease with associated RBD and history of central sleep apnea.  David Krueger is accompanied by his wife again today. I last saw David Krueger on 08/20/2022, at which time David Krueger was doing fairly well.  David Krueger had not fallen but his wife had taken a fall.  David Krueger was advised to continue with his medication regimen, levodopa 1-1/2 pills 4 times daily and ropinirole 3 mg 3 times daily.    Today, 04/01/2023: David Krueger reports feeling fairly stable.  His wife has noticed worsening short-term memory issues especially conversations they just had moments ago.  David Krueger has had the hearing aids since August 2023 but has not used them consistently, she would like for David Krueger to use his hearing aids more.  David Krueger does not always drink enough water, his blood pressure values have been on the lower side.  David Krueger has had some lightheadedness upon standing, no recent falls thankfully.  David Krueger is active physically, likes to play golf, David Krueger also goes to rock steady boxing.  She reports that David Krueger used to be an avid reader but has not been reading as much, David Krueger does have difficulty tracking and feels his eyes get tired when David Krueger tries to read after just about 5 minutes.  David Krueger also does not do any word find puzzles anymore and used to do more to do, which David Krueger does not do as much.  David Krueger does not have much in the way of constipation.  The patient's allergies, current medications, family history, past medical history, past social history, past surgical history and problem list were reviewed and updated as appropriate.    Previously:   I saw David Krueger on 02/18/22, at which time David Krueger reported that his voice has become weaker and more raspy.  David Krueger had no issues with swallowing.  David Krueger denied any issues with constipation.  David Krueger  had noticed worsening tremor.  David Krueger was on once daily long-acting ropinirole 8 mg strength.  David Krueger was advised to increase his carbidopa-levodopa to 1-1/2 pills 4 times a day.  David Krueger was advised to continue with ropinirole long-acting 8 mg once daily.  Patient called in late July 2023 reporting that his prescription coverage for ropinirole long-acting had changed and it has become very expensive.  I suggested that we switch David Krueger to ropinirole IR  3 mg 3 times daily.      I saw David Krueger on 08/20/2021, at which time David Krueger was reported to have more active dreams.  David Krueger was advised to take melatonin consistently.  David Krueger was advised to continue with Sinemet 1 pill 4 times a day and Requip long-acting once daily.  David Krueger was physically active and participated in boxing classes.  David Krueger reported more difficulty with his voice.   I saw David Krueger on 02/18/2021, at which time David Krueger reported doing fairly well, no recent falls, was trying to stay active, was playing golf on a regular basis and also participated in boxing classes 3 days out of the week.  David Krueger found these very beneficial.  His wife had noticed more forgetfulness.  David Krueger had undergone hernia surgery in January 2022.  David Krueger was advised to increase his Sinemet to 1 pill 4 times daily.  David Krueger was advised to continue with ropinirole.  David Krueger was advised to increase his  melatonin to 6 mg daily.     I saw David Krueger on 08/20/2020, at which time David Krueger reported feeling well.  David Krueger was active and enrolled in rock steady boxing for PD.  David Krueger was able to tolerate levodopa and was taking 1 pill 3 times daily.  David Krueger was on once daily long-acting ropinirole 8 mg strength as well.  David Krueger reported 1 recent fall without any major injuries thankfully, David Krueger fell while playing Frisbee with his grandson.  David Krueger was advised to continue with his current medication regimen and follow-up routinely in 6 months.       I saw David Krueger on 04/09/2020, at which time David Krueger reported lightheadedness upon standing up too quickly.  No recent falls.  David Krueger was on Requip  long-acting 8 mg daily but had noticed an increase in his left hand tremor.  His wife noticed that David Krueger had become slower.  Cognitively David Krueger was stable.  David Krueger had stopped using his BiPAP in January 2021 and reported that David Krueger felt better off it.  David Krueger was advised to continue with long-acting ropinirole and start Sinemet.     I saw David Krueger on 10/16/2019, at which time David Krueger was compliant with his BiPAP.  David Krueger was able to tolerate Requip.  David Krueger wanted to start rock steady boxing for Parkinson's disease. David Krueger was advised to continue with Requip at the current dose.      I saw David Krueger on 06/15/2019, at which time David Krueger was compliant with his BiPAP.  David Krueger was taking Requip 2 mg twice daily.  David Krueger had noticed changes in his voice.  David Krueger still had some dream enactment behavior.  His wife also noted that his left side tremor was a little worse.  I suggested we gradually increase the Requip.    Later in August, we increased his Requip to once daily long-acting 8 mg strength.    I reviewed his BiPAP compliance data from 09/17/2019 through 10/16/2019 which is a total of 30 days, during which time David Krueger used his machine with percent use days greater than 4 hours at nearly 90%, 87%, indicating excellent compliance with an average usage of 4 hours and 27 minutes, residual AHI at goal at 1.5/h, leak acceptable with the 95th percentile at 14 L/min with a pressure of 12/7 with a backup rate of 14.     I saw David Krueger on 02/09/2019 in virtual visit, at which time David Krueger reported Doing well, no dream enactment slightly, speech had improved since starting Requip.  David Krueger was tolerating it, was taking 2 mg twice daily.  David Krueger was physically active.  David Krueger did not have any recent constipation issues.  David Krueger was struggling a little bit with the BiPAP but overall was motivated to continue.  His wife felt that the Requip had made a significant difference.   I reviewed his BiPAP ST compliance data from 05/16/2019 through 06/14/2019 which is a total of 30 days, during which time David Krueger used his machine  26 nights, with percent used days greater than 4 hours at 73%, indicating adequate compliance with an average usage of 4 hours and 31 minutes, residual AHI at goal at 2.8/h, leak acceptable with a 95th percentile at 12.1 L/min on a pressure of 12/7 with a rate of 14.     I saw David Krueger on 12/06/2018, at which time we talked about his recent brain SPECT scan, DaT scan, from 12/01/2018 which showed decreased radiotracer activity within the posterior left and right striata. I suggested we start David Krueger on ropinirole with gradual  increase and I made a referral to neuro rehabilitation. We also talked about utilizing coenzyme Q10 over-the-counter as a supplement. We also talked about his sleep study results.   David Krueger had had a baseline sleep study in December 2019 which showed central sleep apnea, David Krueger had a BiPAP titration study in January 2020. His second sleep study showed some changes in keeping with REM behavior disorder.   I reviewed his BiPAP compliance data from 01/03/2019 through 02/01/2019 which is a total of 30 days, during which time David Krueger used his BiPAP every night with percent used days greater than 4 hours at 87%, indicating very good compliance with an average usage of 4 hours and 36 minutes, residual AHI at goal at 1.7 per hour, leak acceptable with the 95th percentile at 7.5 L/m on a pressure of 12/7 with a backup rate of 14.   I first met David Krueger on 09/21/2018 at the request of his primary care physician, at which time David Krueger reported changes in his gait and posture. David Krueger also reported a 3+ year history of vivid dreams and dream enactment behavior. His exam showed evidence of parkinsonism with left-sided lateralization. I suggested further workup for parkinsonism including a DaT scan, and sleep study testing.    David Krueger had a nuclear medicine DaT scan on 12/01/2018 and I reviewed the results: IMPRESSION: Decreased radiotracer activity (dopamine transporter populations) within the posterior LEFT and RIGHT striata (putamen)  is a pattern suggestive of Parkinson's syndrome pathology.   We called David Krueger with his test results.    His baseline sleep study in December 2019 showed moderate primary central sleep apnea. David Krueger was advised to return for a BiPAP titration study, which David Krueger had in January 2020. During the second sleep study also had some REM related increase in muscle tone and vocalization, in keeping with his history of REM behavior disorder.   09/21/2018: (David Krueger) has had some recent changes in his gait and posture. David Krueger has had an intermittent tremor, David Krueger does not notice his motor changes or posture changes himself but his kids have noticed it and wife has noticed it. David Krueger is more concerned about his sleep disturbance. His wife reports that for the past 3+ years David Krueger has had intermittent vivid dreams and erratic behaviors and dreams including movements, twitching, and even more dramatic movements such as grabbing her. She sometimes gets scared because of the unpredictable behaviors in his sleep. They still sleep in the same bed together. They have been married 43 years. She has noticed increase in snoring and sometimes apneic pauses while David Krueger is asleep. David Krueger sometimes reports vivid dreams to her including fighting with an animal but typically does not have recollection of his dreams. David Krueger has a history of Parkinson's disease in paternal grandmother. His own mother lived to be 31 and had trembling or parkinsonism in her 21s. Of note, their son who is currently 30 was diagnosed with essential tremor when David Krueger was a child, maybe as young as 76 years old, no significant progression in the tremor and son has not been on any symptomatic treatment for this. Cognitively, patient is fine. 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 David Krueger is very active physically and socially. David Krueger likes to golf about 3 days out of the week. They have 3 children, son is the oldest, they have 1 daughter in [place] and one in [place], [place]; she has 2 kids, ages 25 and 3. I reviewed  your office note from 09/09/2018, which you kindly included. His Epworth sleepiness  score is 8 out of 24 today, fatigue score is 21 out of 63. David Krueger is particularly worried about his sleep disturbance. David Krueger is married and lives with his wife, David Krueger is a nonsmoker, retired, does not utilize alcohol or caffeine on a regular basis.   His Past Medical History Is Significant For: Past Medical History:  Diagnosis Date   Allergic rhinitis    Back pain    lumbar   Hearing loss    Hypercholesterolemia     His Past Surgical History Is Significant For: Past Surgical History:  Procedure Laterality Date   VASECTOMY      His Family History Is Significant For: Family History  Problem Relation Age of Onset   Parkinson's disease Mother     His Social History Is Significant For: Social History   Socioeconomic History   Marital status: Married    Spouse name: Not on file   Number of children: Not on file   Years of education: Not on file   Highest education level: Not on file  Occupational History   Not on file  Tobacco Use   Smoking status: Never   Smokeless tobacco: Never  Vaping Use   Vaping Use: Never used  Substance and Sexual Activity   Alcohol use: Yes    Alcohol/week: 2.0 standard drinks of alcohol    Types: 2 Cans of beer per week   Drug use: Not Currently   Sexual activity: Not on file  Other Topics Concern   Not on file  Social History Narrative   Not on file   Social Determinants of Health   Financial Resource Strain: Not on file  Food Insecurity: Not on file  Transportation Needs: Not on file  Physical Activity: Not on file  Stress: Not on file  Social Connections: Not on file    His Allergies Are:  No Known Allergies:   His Current Medications Are:  Outpatient Encounter Medications as of 04/01/2023  Medication Sig   carbidopa-levodopa (SINEMET IR) 25-100 MG tablet Take 1.5 tablets by mouth 4 (four) times daily. Take at 6 AM, 10 AM and 2 PM and 6 PM daily    fluticasone (FLONASE) 50 MCG/ACT nasal spray Place into both nostrils daily.   Melatonin 3 MG TABS Take 6 mg by mouth at bedtime.   Multiple Vitamins-Minerals (ICAPS AREDS 2 PO) Take by mouth.   Multiple Vitamins-Minerals (MULTIVITAMIN WITH MINERALS) tablet Take 1 tablet by mouth daily.   Omega-3 Fatty Acids (FISH OIL) 1000 MG CAPS Take by mouth.   rOPINIRole (REQUIP) 3 MG tablet Take 1 tablet (3 mg total) by mouth in the morning, at noon, and at bedtime.   rosuvastatin (CRESTOR) 10 MG tablet Take 10 mg by mouth at bedtime.   No facility-administered encounter medications on file as of 04/01/2023.  :  Review of Systems:  Out of a complete 14 point review of systems, all are reviewed and negative with the exception of these symptoms as listed below:  Review of Systems  Neurological:        Pt here for Parkinson f/u Pt wants to discuss Medication Regiment for Parkinson     Objective:  Neurological Exam  Physical Exam Physical Examination:   Vitals:   04/01/23 1035  BP: 93/62  Pulse: (!) 58    General Examination: The patient is a very pleasant 72 y.o. male in no acute distress. David Krueger appears well-developed and well-nourished and well groomed.   HEENT: Normocephalic, atraumatic, pupils are equal, round and  reactive to light, tracking mildly impaired, corrective eyeglasses in place.  Hearing grossly intact, hearing aids in place bilaterally.  Status post cataract repairs bilaterally.  Face is symmetric with moderate facial masking, intermittently lower jaw tremor noted, moderate nuchal rigidity.  Tongue protrudes centrally and palate elevates symmetrically.  Speech without dysarthria but David Krueger does have a raspy and at times higher pitched voice and moderate hypophonia.   Chest: Clear to auscultation without wheezing, rhonchi or crackles noted.   Heart: S1+S2+0, regular and normal without murmurs, rubs or gallops noted.    Abdomen: Soft, non-tender and non-distended.   Extremities: There  is no pitting edema in the distal lower extremities bilaterally.   Skin: Warm and dry without trophic changes noted.   Musculoskeletal: exam reveals no obvious joint deformities.    Neurologically:  Mental status: The patient is awake, alert and oriented in all 4 spheres. His immediate and remote memory, attention, language skills and fund of knowledge are appropriate. There is no evidence of aphasia, agnosia, apraxia or anomia. Speech with mild raspiness and mild to moderate hypophonia. Thought process is linear. Mood is normal and affect is normal. Good spirits.       08/20/2021   10:47 AM  MMSE - Mini Mental State Exam  Orientation to time 5  Orientation to Place 5  Registration 3  Attention/ Calculation 5  Recall 3  Language- name 2 objects 2  Language- repeat 1  Language- follow 3 step command 3  Language- read & follow direction 1  Write a sentence 1  Copy design 1  Total score 30      On 08/20/2021: CDT: 4/4, AFT: 20/min.  Cranial nerves II - XII are as described above under HEENT exam. In addition: shoulder shrug is unequal with left shoulder slightly higher than right, seems stable.   Motor exam: Normal bulk, strength and tone with the exception of mild cogwheeling in the left upper extremity, slight cogwheeling in the right upper extremity also. David Krueger has an intermittent mild resting tremor in the left upper extremity no other resting tremor noted today. No obvious dyskinesias.  Overall mild to moderate bradykinesia.      (On 09/21/2018:on Archimedes spiral drawing David Krueger has no significant difficulty with his right hand which is his dominant hand, David Krueger has mild insecurity but no obvious trembling with the left hand, handwriting is legible, not particularly tremulous, but on the smaller side.)   David Krueger has no significant postural or action tremor, no intention tremor. On fine motor testing David Krueger has mild difficulty on the right, and more moderate findings on the left.  Mild bradykinesia.   Cerebellar testing: No dysmetria or intention tremor on finger to nose testing. There is no truncal or gait ataxia.  Sensory exam: intact to light touch in the upper and lower extremities.  Gait, station and balance: David Krueger stands without difficulty and posture is mild to moderately stooped for age, slightly worse.  David Krueger walks with fairly good stride length, decreased pace and decreased arm swing on the left more than right.  Slight insecurity with turns.    Assessment and Plan:    In summary, CHERIE KHERA is a very pleasant 72 year old male with an underlying medical history of allergic rhinitis, hyperlipidemia, and anemia, who presents for follow-up consultation of his left-sided predominant Parkinson's disease, associated with sleep disordered breathing including central sleep apnea (could not tolerate BiPAP), REM behavior disorder, and mild (subjective) forgetfulness.  Memory scores were good in October 2022 but  David Krueger has had a decline in his short-term memory.  David Krueger has had hearing aids since August 2023 and is encouraged to use them consistently as it can help with retaining information.  We talked about compensation strategies for short-term memory issues.  We may consider memory medication in the near future and will plan an MMSE at the next visit. David Krueger is currently on Sinemet 1-1/2 pills 4 times daily, we switched David Krueger from ropinirole long-acting 8 mg once daily to 3 mg 3 times daily in July 2023 due to cost.    David Krueger could not tolerate BiPAP and eventually stopped using the machine in January 2021, felt better without it.   David Krueger had a DaTscan on 12/01/2018 which showed findings supportive of an underlying parkinsonian syndrome. His history and prior sleep study results were supportive of REM behavior disorder as well.   David Krueger was found to have central sleep apnea and has started BiPAP therapy for this in January 2020. David Krueger was compliant with his BiPAP ST, but stopped using BiPAP in January 2021.   David Krueger was on  Requip long-acting for his Parkinson's disease and we increased it to 6 mg daily in August 2020 and then further to 8 mg. We then switched to generic immediate release ropinirole 3 mg 3 times daily in July 2023.    We will continue with his current medication regimen.    David Krueger continues to be active, David Krueger is encouraged to stay better hydrated with water.  David Krueger is advised to stand up slowly and get his bearings and monitor for low blood pressure.    We increased his levodopa to 1 pill 4 times a day in April 2022.   David Krueger had hernia surgery in January 2022 with bilateral repair and a follow-up appointment in February 2022.  We will plan to follow-up routinely in 6 months, sooner if needed.  I answered all their questions today to the patient and his wife were in agreement.

## 2023-04-01 NOTE — Patient Instructions (Signed)
It was nice to see you again today.  We will monitor your memory and do a memory test again next time.  We may consider medication for memory loss in the near future.  Please try to hydrate better with water, 6 to 8 cups or 3-4 bottles of water per day are generally recommended, you can also use Gatorade or propel water or flavorings.  Please stand up slowly and get your bearings first.  Please try to use your hearing aids more consistently.  He will continue with your current medication regimen which is levodopa 1-1/2 pills 4 times a day, 4 hourly, and ropinirole 3 mg 3 times a day.

## 2023-05-01 ENCOUNTER — Other Ambulatory Visit: Payer: Self-pay | Admitting: Neurology

## 2023-05-03 DIAGNOSIS — Z6823 Body mass index (BMI) 23.0-23.9, adult: Secondary | ICD-10-CM | POA: Diagnosis not present

## 2023-05-03 DIAGNOSIS — M545 Low back pain, unspecified: Secondary | ICD-10-CM | POA: Diagnosis not present

## 2023-05-17 DIAGNOSIS — M545 Low back pain, unspecified: Secondary | ICD-10-CM | POA: Diagnosis not present

## 2023-05-20 DIAGNOSIS — M545 Low back pain, unspecified: Secondary | ICD-10-CM | POA: Diagnosis not present

## 2023-05-26 DIAGNOSIS — M545 Low back pain, unspecified: Secondary | ICD-10-CM | POA: Diagnosis not present

## 2023-05-28 DIAGNOSIS — M545 Low back pain, unspecified: Secondary | ICD-10-CM | POA: Diagnosis not present

## 2023-06-01 DIAGNOSIS — H8111 Benign paroxysmal vertigo, right ear: Secondary | ICD-10-CM | POA: Diagnosis not present

## 2023-06-01 DIAGNOSIS — M545 Low back pain, unspecified: Secondary | ICD-10-CM | POA: Diagnosis not present

## 2023-06-03 DIAGNOSIS — M545 Low back pain, unspecified: Secondary | ICD-10-CM | POA: Diagnosis not present

## 2023-06-03 DIAGNOSIS — H8111 Benign paroxysmal vertigo, right ear: Secondary | ICD-10-CM | POA: Diagnosis not present

## 2023-06-10 DIAGNOSIS — H8111 Benign paroxysmal vertigo, right ear: Secondary | ICD-10-CM | POA: Diagnosis not present

## 2023-06-10 DIAGNOSIS — M545 Low back pain, unspecified: Secondary | ICD-10-CM | POA: Diagnosis not present

## 2023-06-15 DIAGNOSIS — H8111 Benign paroxysmal vertigo, right ear: Secondary | ICD-10-CM | POA: Diagnosis not present

## 2023-06-15 DIAGNOSIS — M545 Low back pain, unspecified: Secondary | ICD-10-CM | POA: Diagnosis not present

## 2023-06-18 DIAGNOSIS — M545 Low back pain, unspecified: Secondary | ICD-10-CM | POA: Diagnosis not present

## 2023-06-18 DIAGNOSIS — H8111 Benign paroxysmal vertigo, right ear: Secondary | ICD-10-CM | POA: Diagnosis not present

## 2023-06-24 DIAGNOSIS — M545 Low back pain, unspecified: Secondary | ICD-10-CM | POA: Diagnosis not present

## 2023-06-24 DIAGNOSIS — H8111 Benign paroxysmal vertigo, right ear: Secondary | ICD-10-CM | POA: Diagnosis not present

## 2023-06-30 DIAGNOSIS — H8111 Benign paroxysmal vertigo, right ear: Secondary | ICD-10-CM | POA: Diagnosis not present

## 2023-06-30 DIAGNOSIS — M545 Low back pain, unspecified: Secondary | ICD-10-CM | POA: Diagnosis not present

## 2023-06-30 DIAGNOSIS — Z125 Encounter for screening for malignant neoplasm of prostate: Secondary | ICD-10-CM | POA: Diagnosis not present

## 2023-06-30 DIAGNOSIS — Z79899 Other long term (current) drug therapy: Secondary | ICD-10-CM | POA: Diagnosis not present

## 2023-06-30 DIAGNOSIS — Z1331 Encounter for screening for depression: Secondary | ICD-10-CM | POA: Diagnosis not present

## 2023-06-30 DIAGNOSIS — E7800 Pure hypercholesterolemia, unspecified: Secondary | ICD-10-CM | POA: Diagnosis not present

## 2023-06-30 DIAGNOSIS — Z0000 Encounter for general adult medical examination without abnormal findings: Secondary | ICD-10-CM | POA: Diagnosis not present

## 2023-06-30 DIAGNOSIS — Z1339 Encounter for screening examination for other mental health and behavioral disorders: Secondary | ICD-10-CM | POA: Diagnosis not present

## 2023-06-30 DIAGNOSIS — Z6823 Body mass index (BMI) 23.0-23.9, adult: Secondary | ICD-10-CM | POA: Diagnosis not present

## 2023-07-05 DIAGNOSIS — H8111 Benign paroxysmal vertigo, right ear: Secondary | ICD-10-CM | POA: Diagnosis not present

## 2023-07-05 DIAGNOSIS — M545 Low back pain, unspecified: Secondary | ICD-10-CM | POA: Diagnosis not present

## 2023-07-06 DIAGNOSIS — H02834 Dermatochalasis of left upper eyelid: Secondary | ICD-10-CM | POA: Diagnosis not present

## 2023-07-07 DIAGNOSIS — H8111 Benign paroxysmal vertigo, right ear: Secondary | ICD-10-CM | POA: Diagnosis not present

## 2023-07-07 DIAGNOSIS — M545 Low back pain, unspecified: Secondary | ICD-10-CM | POA: Diagnosis not present

## 2023-07-14 DIAGNOSIS — H8111 Benign paroxysmal vertigo, right ear: Secondary | ICD-10-CM | POA: Diagnosis not present

## 2023-07-14 DIAGNOSIS — M545 Low back pain, unspecified: Secondary | ICD-10-CM | POA: Diagnosis not present

## 2023-07-19 DIAGNOSIS — M545 Low back pain, unspecified: Secondary | ICD-10-CM | POA: Diagnosis not present

## 2023-07-19 DIAGNOSIS — H8111 Benign paroxysmal vertigo, right ear: Secondary | ICD-10-CM | POA: Diagnosis not present

## 2023-08-03 DIAGNOSIS — Z23 Encounter for immunization: Secondary | ICD-10-CM | POA: Diagnosis not present

## 2023-09-08 DIAGNOSIS — M545 Low back pain, unspecified: Secondary | ICD-10-CM | POA: Diagnosis not present

## 2023-09-08 DIAGNOSIS — Z6823 Body mass index (BMI) 23.0-23.9, adult: Secondary | ICD-10-CM | POA: Diagnosis not present

## 2023-09-13 DIAGNOSIS — M545 Low back pain, unspecified: Secondary | ICD-10-CM | POA: Diagnosis not present

## 2023-09-16 DIAGNOSIS — M545 Low back pain, unspecified: Secondary | ICD-10-CM | POA: Diagnosis not present

## 2023-09-20 DIAGNOSIS — M545 Low back pain, unspecified: Secondary | ICD-10-CM | POA: Diagnosis not present

## 2023-09-22 DIAGNOSIS — M545 Low back pain, unspecified: Secondary | ICD-10-CM | POA: Diagnosis not present

## 2023-09-27 DIAGNOSIS — M545 Low back pain, unspecified: Secondary | ICD-10-CM | POA: Diagnosis not present

## 2023-09-30 ENCOUNTER — Encounter: Payer: Self-pay | Admitting: Neurology

## 2023-09-30 ENCOUNTER — Ambulatory Visit: Payer: PPO | Admitting: Neurology

## 2023-09-30 VITALS — BP 121/73 | HR 64 | Ht 67.0 in | Wt 156.0 lb

## 2023-09-30 DIAGNOSIS — G20A2 Parkinson's disease without dyskinesia, with fluctuations: Secondary | ICD-10-CM

## 2023-09-30 DIAGNOSIS — G4752 REM sleep behavior disorder: Secondary | ICD-10-CM | POA: Diagnosis not present

## 2023-09-30 DIAGNOSIS — R6889 Other general symptoms and signs: Secondary | ICD-10-CM

## 2023-09-30 DIAGNOSIS — G8929 Other chronic pain: Secondary | ICD-10-CM

## 2023-09-30 DIAGNOSIS — R498 Other voice and resonance disorders: Secondary | ICD-10-CM

## 2023-09-30 DIAGNOSIS — M545 Low back pain, unspecified: Secondary | ICD-10-CM | POA: Diagnosis not present

## 2023-09-30 NOTE — Progress Notes (Signed)
Subjective:    Patient ID: David Krueger is a 72 y.o. male.  HPI    Interim history:   David Krueger is a 72 year old right-handed gentleman with an underlying medical history of allergic rhinitis, hyperlipidemia, and anemia, who presents for follow-up consultation of his Parkinson's disease with associated RBD and history of central sleep apnea.  He is accompanied by his wife again today. I last saw him on 04/01/2023, at which time he reported feeling fairly stable.  His wife had noticed short-term memory issues which had become worse.  He had had some lower blood pressure values.  He was advised to hydrate better with water.  He was encouraged to use his hearing aids consistently.  He was advised to continue with levodopa 1-1/2 pills 4 times a day and ropinirole 3 mg 3 times daily.  Today, 09/30/2023: He reports better, had interim back issues, about 6 weeks ago had significant back pain, treated with local heat and local lidocaine patch, has not seen an orthopedic specialist yet but considering an MRI if he does not get better.  He has actually improved and physical therapy has helped as well.  No falls.  Tries to hydrate well.  Short-term memory still not very good, maybe a little worse even.  Tries to use his hearing aids more consistently but not all the time.  Motor wise is doing well, no new issues.  He takes melatonin 6 mg at bedtime, continues to take levodopa 1-1/2 pills 4 times a day and ropinirole 3 mg 3 times daily.    The patient's allergies, current medications, family history, past medical history, past social history, past surgical history and problem list were reviewed and updated as appropriate.    Previously:  I saw him on 08/20/2022, at which time he was doing fairly well.  He had not fallen but his wife had taken a fall.  He was advised to continue with his medication regimen, levodopa 1-1/2 pills 4 times daily and ropinirole 3 mg 3 times daily.     I saw him on  02/18/22, at which time he reported that his voice has become weaker and more raspy.  He had no issues with swallowing.  He denied any issues with constipation.  He had noticed worsening tremor.  He was on once daily long-acting ropinirole 8 mg strength.  He was advised to increase his carbidopa-levodopa to 1-1/2 pills 4 times a day.  He was advised to continue with ropinirole long-acting 8 mg once daily.  Patient called in late July 2023 reporting that his prescription coverage for ropinirole long-acting had changed and it has become very expensive.  I suggested that we switch him to ropinirole IR  3 mg 3 times daily.       I saw him on 08/20/2021, at which time he was reported to have more active dreams.  He was advised to take melatonin consistently.  He was advised to continue with Sinemet 1 pill 4 times a day and Requip long-acting once daily.  He was physically active and participated in boxing classes.  He reported more difficulty with his voice.   I saw him on 02/18/2021, at which time he reported doing fairly well, no recent falls, was trying to stay active, was playing golf on a regular basis and also participated in boxing classes 3 days out of the week.  He found these very beneficial.  His wife had noticed more forgetfulness.  He had undergone hernia surgery in January 2022.  He was advised to increase his Sinemet to 1 pill 4 times daily.  He was advised to continue with ropinirole.  He was advised to increase his melatonin to 6 mg daily.     I saw him on 08/20/2020, at which time he reported feeling well.  He was active and enrolled in rock steady boxing for PD.  He was able to tolerate levodopa and was taking 1 pill 3 times daily.  He was on once daily long-acting ropinirole 8 mg strength as well.  He reported 1 recent fall without any major injuries thankfully, he fell while playing Frisbee with his grandson.  He was advised to continue with his current medication regimen and follow-up  routinely in 6 months.       I saw him on 04/09/2020, at which time he reported lightheadedness upon standing up too quickly.  No recent falls.  He was on Requip long-acting 8 mg daily but had noticed an increase in his left hand tremor.  His wife noticed that he had become slower.  Cognitively he was stable.  He had stopped using his BiPAP in January 2021 and reported that he felt better off it.  He was advised to continue with long-acting ropinirole and start Sinemet.     I saw him on 10/16/2019, at which time he was compliant with his BiPAP.  He was able to tolerate Requip.  He wanted to start rock steady boxing for Parkinson's disease. He was advised to continue with Requip at the current dose.      I saw him on 06/15/2019, at which time he was compliant with his BiPAP.  He was taking Requip 2 mg twice daily.  He had noticed changes in his voice.  He still had some dream enactment behavior.  His wife also noted that his left side tremor was a little worse.  I suggested we gradually increase the Requip.    Later in August, we increased his Requip to once daily long-acting 8 mg strength.    I reviewed his BiPAP compliance data from 09/17/2019 through 10/16/2019 which is a total of 30 days, during which time he used his machine with percent use days greater than 4 hours at nearly 90%, 87%, indicating excellent compliance with an average usage of 4 hours and 27 minutes, residual AHI at goal at 1.5/h, leak acceptable with the 95th percentile at 14 L/min with a pressure of 12/7 with a backup rate of 14.     I saw him on 02/09/2019 in virtual visit, at which time he reported Doing well, no dream enactment slightly, speech had improved since starting Requip.  He was tolerating it, was taking 2 mg twice daily.  He was physically active.  He did not have any recent constipation issues.  He was struggling a little bit with the BiPAP but overall was motivated to continue.  His wife felt that the Requip had made a  significant difference.   I reviewed his BiPAP ST compliance data from 05/16/2019 through 06/14/2019 which is a total of 30 days, during which time he used his machine 26 nights, with percent used days greater than 4 hours at 73%, indicating adequate compliance with an average usage of 4 hours and 31 minutes, residual AHI at goal at 2.8/h, leak acceptable with a 95th percentile at 12.1 L/min on a pressure of 12/7 with a rate of 14.     I saw him on 12/06/2018, at which time we talked about his recent  brain SPECT scan, DaT scan, from 12/01/2018 which showed decreased radiotracer activity within the posterior left and right striata. I suggested we start him on ropinirole with gradual increase and I made a referral to neuro rehabilitation. We also talked about utilizing coenzyme Q10 over-the-counter as a supplement. We also talked about his sleep study results.   He had had a baseline sleep study in December 2019 which showed central sleep apnea, he had a BiPAP titration study in January 2020. His second sleep study showed some changes in keeping with REM behavior disorder.   I reviewed his BiPAP compliance data from 01/03/2019 through 02/01/2019 which is a total of 30 days, during which time he used his BiPAP every night with percent used days greater than 4 hours at 87%, indicating very good compliance with an average usage of 4 hours and 36 minutes, residual AHI at goal at 1.7 per hour, leak acceptable with the 95th percentile at 7.5 L/m on a pressure of 12/7 with a backup rate of 14.   I first met him on 09/21/2018 at the request of his primary care physician, at which time he reported changes in his gait and posture. He also reported a 3+ year history of vivid dreams and dream enactment behavior. His exam showed evidence of parkinsonism with left-sided lateralization. I suggested further workup for parkinsonism including a DaT scan, and sleep study testing.    He had a nuclear medicine DaT scan on  12/01/2018 and I reviewed the results: IMPRESSION: Decreased radiotracer activity (dopamine transporter populations) within the posterior LEFT and RIGHT striata (putamen) is a pattern suggestive of Parkinson's syndrome pathology.   We called him with his test results.    His baseline sleep study in December 2019 showed moderate primary central sleep apnea. He was advised to return for a BiPAP titration study, which he had in January 2020. During the second sleep study also had some REM related increase in muscle tone and vocalization, in keeping with his history of REM behavior disorder.   09/21/2018: (He) has had some recent changes in his gait and posture. He has had an intermittent tremor, he does not notice his motor changes or posture changes himself but his kids have noticed it and wife has noticed it. He is more concerned about his sleep disturbance. His wife reports that for the past 3+ years he has had intermittent vivid dreams and erratic behaviors and dreams including movements, twitching, and even more dramatic movements such as grabbing her. She sometimes gets scared because of the unpredictable behaviors in his sleep. They still sleep in the same bed together. They have been married 43 years. She has noticed increase in snoring and sometimes apneic pauses while he is asleep. He sometimes reports vivid dreams to her including fighting with an animal but typically does not have recollection of his dreams. He has a history of Parkinson's disease in paternal grandmother. His own mother lived to be 17 and had trembling or parkinsonism in her 47s. Of note, their son who is currently 39 was diagnosed with essential tremor when he was a child, maybe as young as 29 years old, no significant progression in the tremor and son has not been on any symptomatic treatment for this. Cognitively, patient is fine. He retired last year, he was a Clinical research associate. He is very active physically and socially. He likes to golf  about 3 days out of the week. They have 3 children, son is the oldest, they have 1 daughter in  [place] and one in [place], [place]; she has 2 kids, ages 47 and 3. I reviewed your office note from 09/09/2018, which you kindly included. His Epworth sleepiness score is 8 out of 24 today, fatigue score is 21 out of 63. He is particularly worried about his sleep disturbance. He is married and lives with his wife, he is a nonsmoker, retired, does not utilize alcohol or caffeine on a regular basis.   His Past Medical History Is Significant For: Past Medical History:  Diagnosis Date   Allergic rhinitis    Back pain    lumbar   Hearing loss    Hypercholesterolemia     His Past Surgical History Is Significant For: Past Surgical History:  Procedure Laterality Date   VASECTOMY      His Family History Is Significant For: Family History  Problem Relation Age of Onset   Parkinson's disease Mother     His Social History Is Significant For: Social History   Socioeconomic History   Marital status: Married    Spouse name: Not on file   Number of children: Not on file   Years of education: Not on file   Highest education level: Not on file  Occupational History   Not on file  Tobacco Use   Smoking status: Never   Smokeless tobacco: Never  Vaping Use   Vaping status: Never Used  Substance and Sexual Activity   Alcohol use: Yes    Alcohol/week: 2.0 standard drinks of alcohol    Types: 2 Cans of beer per week   Drug use: Not Currently   Sexual activity: Not on file  Other Topics Concern   Not on file  Social History Narrative   Not on file   Social Determinants of Health   Financial Resource Strain: Not on file  Food Insecurity: Not on file  Transportation Needs: Not on file  Physical Activity: Not on file  Stress: Not on file  Social Connections: Not on file    His Allergies Are:  No Known Allergies:   His Current Medications Are:  Outpatient Encounter Medications as of  09/30/2023  Medication Sig   carbidopa-levodopa (SINEMET IR) 25-100 MG tablet Take 1.5 tablets by mouth 4 (four) times daily. Take at 6 AM, 10 AM and 2 PM and 6 PM daily   fluticasone (FLONASE) 50 MCG/ACT nasal spray Place into both nostrils daily.   Melatonin 3 MG TABS Take 6 mg by mouth at bedtime.   Multiple Vitamins-Minerals (ICAPS AREDS 2 PO) Take by mouth.   Multiple Vitamins-Minerals (MULTIVITAMIN WITH MINERALS) tablet Take 1 tablet by mouth daily.   Omega-3 Fatty Acids (FISH OIL) 1000 MG CAPS Take by mouth.   rOPINIRole (REQUIP) 3 MG tablet Take 1 tablet (3 mg total) by mouth in the morning, at noon, and at bedtime.   rosuvastatin (CRESTOR) 10 MG tablet Take 10 mg by mouth at bedtime.   No facility-administered encounter medications on file as of 09/30/2023.  :  Review of Systems:  Out of a complete 14 point review of systems, all are reviewed and negative with the exception of these symptoms as listed below:   Review of Systems  Neurological:        Pt is here for Parkinson's follow up. Pt' wife states he shuffles his feet. Tremors are well controlled. States his voice is strangled. States his short term memory has been declining. States he is continuing with PT.     Objective:  Neurological Exam  Physical  Exam Physical Examination:   Vitals:   09/30/23 1027  BP: 121/73  Pulse: 64    General Examination: The patient is a very pleasant 72 y.o. male in no acute distress. He appears well-developed and well-nourished and well groomed.   HEENT: Normocephalic, atraumatic, pupils are equal, round and reactive to light, tracking mildly impaired, corrective eyeglasses in place.  Hearing grossly intact, hearing aids in place bilaterally.  Status post cataract repairs bilaterally.  Face is symmetric with moderate facial masking, intermittently lower jaw tremor noted, moderate nuchal rigidity.  Tongue protrudes centrally and palate elevates symmetrically.  Speech without dysarthria  but he does have a raspy and at times higher pitched voice and moderate hypophonia, overall stable.   Chest: Clear to auscultation without wheezing, rhonchi or crackles noted.   Heart: S1+S2+0, regular and normal without murmurs, rubs or gallops noted.    Abdomen: Soft, non-tender and non-distended.   Extremities: There is no pitting edema in the distal lower extremities bilaterally.   Skin: Warm and dry without trophic changes noted.   Musculoskeletal: exam reveals no obvious joint deformities.    Neurologically:  Mental status: The patient is awake, alert and oriented in all 4 spheres. His immediate and remote memory, attention, language skills and fund of knowledge are appropriate. There is no evidence of aphasia, agnosia, apraxia or anomia. Speech with mild raspiness and mild to moderate hypophonia. Thought process is linear. Mood is normal and affect is normal. Good spirits.       09/30/2023   10:27 AM 08/20/2021   10:47 AM  MMSE - Mini Mental State Exam  Orientation to time 4 5  Orientation to Place 5 5  Registration 3 3  Attention/ Calculation 5 5  Recall 3 3  Language- name 2 objects 2 2  Language- repeat 1 1  Language- follow 3 step command 3 3  Language- read & follow direction 1 1  Write a sentence 1 1  Copy design 1 1  Total score 29 30    On 09/30/2023: CDT: 4/4, AFT: 13/min.    On 08/20/2021: CDT: 4/4, AFT: 20/min.   Cranial nerves II - XII are as described above under HEENT exam. In addition: shoulder shrug is unequal with left shoulder slightly higher than right, seems stable.   Motor exam: Normal bulk, strength and tone with the exception of mild cogwheeling in the left upper extremity, slight cogwheeling in the right upper extremity also. He has an intermittent mild resting tremor in the left upper extremity no other resting tremor noted today. No obvious dyskinesias.  Overall mild to moderate bradykinesia, stable.      (On 09/21/2018:on Archimedes spiral  drawing he has no significant difficulty with his right hand which is his dominant hand, he has mild insecurity but no obvious trembling with the left hand, handwriting is legible, not particularly tremulous, but on the smaller side.)   He has no significant postural or action tremor, no intention tremor. On fine motor testing he has mild difficulty on the right, and more moderate findings on the left.  Mild bradykinesia.  Cerebellar testing: No dysmetria or intention tremor on finger to nose testing. There is no truncal or gait ataxia.  Sensory exam: intact to light touch in the upper and lower extremities.  Gait, station and balance: He stands without difficulty and posture is mildly stooped for age. He walks with fairly good stride length, decreased pace and decreased arm swing on the left more than right.  Slight  insecurity with turns. No walking aid.   Assessment and Plan:    In summary, JEMELLE JANSMA is a very pleasant 72 year old male with an underlying medical history of allergic rhinitis, hyperlipidemia, and anemia, who presents for follow-up consultation of his left-sided predominant Parkinson's disease, associated with sleep disordered breathing including central sleep apnea (could not tolerate BiPAP), REM behavior disorder, and mild (subjective) forgetfulness.  Memory scores were good in October 2022 and slightly worse today but not alarming.  He has had hearing aids since August 2023 and is encouraged to use them consistently as it can help with comprehension of course and retaining information.  We talked about compensation strategies for short-term memory issues.  We may consider memory medication in the near future and will plan an MMSE in 6 to 12 months from now.  We mutually agreed not to utilize any dementia medication at this time.  He is advised to stay well-hydrated and well rested and continue with  Sinemet 1-1/2 pills 4 times daily, we switched him from ropinirole long-acting 8 mg  once daily to 3 mg 3 times daily in July 2023 (due to cost).     He could not tolerate BiPAP and eventually stopped using the machine in January 2021, felt better without it.    He had a DaTscan on 12/01/2018 which showed findings supportive of an underlying parkinsonian syndrome. His history and prior sleep study results were supportive of REM behavior disorder as well.    He was found to have central sleep apnea and has started BiPAP therapy for this in January 2020. He was compliant with his BiPAP ST, but stopped using BiPAP in January 2021.    He was on Requip long-acting for his Parkinson's disease and we increased it to 6 mg daily in August 2020 and then further to 8 mg. We then switched to generic immediate release ropinirole 3 mg 3 times daily in July 2023.     He had interim issues with his back, currently treated with lidocaine patch and physical therapy.  He has considered a MRI with his PCP, he has not seen a specialist yet.  He is feeling better.    He continues to be active, likes to play golf.     He had hernia surgery in January 2022 with bilateral repairs.   We will plan to follow-up routinely in 6 months, sooner if needed.  I answered all their questions today to the patient and his wife were in agreement.  I spent 30 minutes in total face-to-face time and in reviewing records during pre-charting, more than 50% of which was spent in counseling and coordination of care, reviewing test results, reviewing medications and treatment regimen and/or in discussing or reviewing the diagnosis of PD, the prognosis and treatment options. Pertinent laboratory and imaging test results that were available during this visit with the patient were reviewed by me and considered in my medical decision making (see chart for details).

## 2023-09-30 NOTE — Patient Instructions (Signed)
It was nice to see you both again today.  We will stay the course with your medications and monitor your memory.  Please use your hearing aids consistently, stay well-hydrated and well rested.  Continue with your medication regimen.  Follow-up in 6 months.

## 2023-10-04 DIAGNOSIS — M545 Low back pain, unspecified: Secondary | ICD-10-CM | POA: Diagnosis not present

## 2023-10-11 DIAGNOSIS — M545 Low back pain, unspecified: Secondary | ICD-10-CM | POA: Diagnosis not present

## 2023-10-14 DIAGNOSIS — M545 Low back pain, unspecified: Secondary | ICD-10-CM | POA: Diagnosis not present

## 2023-10-18 DIAGNOSIS — M545 Low back pain, unspecified: Secondary | ICD-10-CM | POA: Diagnosis not present

## 2023-10-25 DIAGNOSIS — M545 Low back pain, unspecified: Secondary | ICD-10-CM | POA: Diagnosis not present

## 2023-11-12 DIAGNOSIS — M545 Low back pain, unspecified: Secondary | ICD-10-CM | POA: Diagnosis not present

## 2023-11-17 DIAGNOSIS — M545 Low back pain, unspecified: Secondary | ICD-10-CM | POA: Diagnosis not present

## 2023-12-09 DIAGNOSIS — M545 Low back pain, unspecified: Secondary | ICD-10-CM | POA: Diagnosis not present

## 2023-12-17 DIAGNOSIS — R509 Fever, unspecified: Secondary | ICD-10-CM | POA: Diagnosis not present

## 2023-12-17 DIAGNOSIS — U071 COVID-19: Secondary | ICD-10-CM | POA: Diagnosis not present

## 2023-12-23 DIAGNOSIS — M9903 Segmental and somatic dysfunction of lumbar region: Secondary | ICD-10-CM | POA: Diagnosis not present

## 2023-12-23 DIAGNOSIS — M9902 Segmental and somatic dysfunction of thoracic region: Secondary | ICD-10-CM | POA: Diagnosis not present

## 2023-12-23 DIAGNOSIS — M9904 Segmental and somatic dysfunction of sacral region: Secondary | ICD-10-CM | POA: Diagnosis not present

## 2023-12-27 DIAGNOSIS — M9902 Segmental and somatic dysfunction of thoracic region: Secondary | ICD-10-CM | POA: Diagnosis not present

## 2023-12-27 DIAGNOSIS — M9903 Segmental and somatic dysfunction of lumbar region: Secondary | ICD-10-CM | POA: Diagnosis not present

## 2023-12-27 DIAGNOSIS — M9904 Segmental and somatic dysfunction of sacral region: Secondary | ICD-10-CM | POA: Diagnosis not present

## 2023-12-29 DIAGNOSIS — M9903 Segmental and somatic dysfunction of lumbar region: Secondary | ICD-10-CM | POA: Diagnosis not present

## 2023-12-29 DIAGNOSIS — M9902 Segmental and somatic dysfunction of thoracic region: Secondary | ICD-10-CM | POA: Diagnosis not present

## 2023-12-29 DIAGNOSIS — M9904 Segmental and somatic dysfunction of sacral region: Secondary | ICD-10-CM | POA: Diagnosis not present

## 2024-01-10 DIAGNOSIS — Z6823 Body mass index (BMI) 23.0-23.9, adult: Secondary | ICD-10-CM | POA: Diagnosis not present

## 2024-01-10 DIAGNOSIS — M419 Scoliosis, unspecified: Secondary | ICD-10-CM | POA: Diagnosis not present

## 2024-01-13 DIAGNOSIS — M545 Low back pain, unspecified: Secondary | ICD-10-CM | POA: Diagnosis not present

## 2024-01-13 DIAGNOSIS — M47816 Spondylosis without myelopathy or radiculopathy, lumbar region: Secondary | ICD-10-CM | POA: Diagnosis not present

## 2024-01-13 DIAGNOSIS — M419 Scoliosis, unspecified: Secondary | ICD-10-CM | POA: Diagnosis not present

## 2024-01-13 DIAGNOSIS — G20A1 Parkinson's disease without dyskinesia, without mention of fluctuations: Secondary | ICD-10-CM | POA: Diagnosis not present

## 2024-01-27 DIAGNOSIS — M47816 Spondylosis without myelopathy or radiculopathy, lumbar region: Secondary | ICD-10-CM | POA: Diagnosis not present

## 2024-01-27 DIAGNOSIS — M545 Low back pain, unspecified: Secondary | ICD-10-CM | POA: Diagnosis not present

## 2024-01-28 DIAGNOSIS — M48061 Spinal stenosis, lumbar region without neurogenic claudication: Secondary | ICD-10-CM | POA: Diagnosis not present

## 2024-01-28 DIAGNOSIS — M47816 Spondylosis without myelopathy or radiculopathy, lumbar region: Secondary | ICD-10-CM | POA: Diagnosis not present

## 2024-02-03 DIAGNOSIS — M545 Low back pain, unspecified: Secondary | ICD-10-CM | POA: Diagnosis not present

## 2024-02-03 DIAGNOSIS — M47816 Spondylosis without myelopathy or radiculopathy, lumbar region: Secondary | ICD-10-CM | POA: Diagnosis not present

## 2024-02-03 DIAGNOSIS — G20A1 Parkinson's disease without dyskinesia, without mention of fluctuations: Secondary | ICD-10-CM | POA: Diagnosis not present

## 2024-02-04 DIAGNOSIS — G20A1 Parkinson's disease without dyskinesia, without mention of fluctuations: Secondary | ICD-10-CM | POA: Diagnosis not present

## 2024-02-04 DIAGNOSIS — M48061 Spinal stenosis, lumbar region without neurogenic claudication: Secondary | ICD-10-CM | POA: Diagnosis not present

## 2024-02-04 DIAGNOSIS — M47816 Spondylosis without myelopathy or radiculopathy, lumbar region: Secondary | ICD-10-CM | POA: Diagnosis not present

## 2024-02-10 DIAGNOSIS — H353122 Nonexudative age-related macular degeneration, left eye, intermediate dry stage: Secondary | ICD-10-CM | POA: Diagnosis not present

## 2024-02-29 DIAGNOSIS — M47816 Spondylosis without myelopathy or radiculopathy, lumbar region: Secondary | ICD-10-CM | POA: Diagnosis not present

## 2024-03-27 DIAGNOSIS — M47816 Spondylosis without myelopathy or radiculopathy, lumbar region: Secondary | ICD-10-CM | POA: Diagnosis not present

## 2024-03-29 ENCOUNTER — Encounter: Payer: Self-pay | Admitting: Neurology

## 2024-03-29 ENCOUNTER — Ambulatory Visit: Payer: PPO | Admitting: Neurology

## 2024-03-29 VITALS — BP 119/66 | HR 67 | Ht 68.0 in | Wt 155.6 lb

## 2024-03-29 DIAGNOSIS — M545 Low back pain, unspecified: Secondary | ICD-10-CM

## 2024-03-29 DIAGNOSIS — G479 Sleep disorder, unspecified: Secondary | ICD-10-CM | POA: Diagnosis not present

## 2024-03-29 DIAGNOSIS — R6889 Other general symptoms and signs: Secondary | ICD-10-CM | POA: Diagnosis not present

## 2024-03-29 DIAGNOSIS — R413 Other amnesia: Secondary | ICD-10-CM | POA: Diagnosis not present

## 2024-03-29 DIAGNOSIS — G8929 Other chronic pain: Secondary | ICD-10-CM

## 2024-03-29 DIAGNOSIS — G20A2 Parkinson's disease without dyskinesia, with fluctuations: Secondary | ICD-10-CM | POA: Diagnosis not present

## 2024-03-29 MED ORDER — RIVASTIGMINE TARTRATE 1.5 MG PO CAPS: 1.5000 mg | ORAL_CAPSULE | Freq: Two times a day (BID) | ORAL | 3 refills | Status: DC

## 2024-03-29 NOTE — Patient Instructions (Signed)
 We will start for your memory: Exelon generic 1.5 mg caps twice daily.  Side effects may include dry eyes, dry mouth, confusion, low pulse, low blood pressure, also GI related side effects (nausea, vomiting, diarrhea, constipation), headaches; rare side effects may include hallucinations and seizures.   Call or email through MyChart in about 1 mon for an update and we may increase the Exelon to 3 mg twice daily.   Follow up with your ortho doctor and with us  in 6 mo to see the NP.

## 2024-03-29 NOTE — Progress Notes (Signed)
 Subjective:    Patient ID: David Krueger is a 73 y.o. male.  HPI    Interim history:   David Krueger is a 73 year old right-handed gentleman with an underlying medical history of allergic rhinitis, hyperlipidemia, scoliosis, lumbar spondylosis, left macular degeneration, and anemia, who presents for follow-up consultation of his Parkinson's disease, complicated by scoliosis, memory loss, LBP, RBD and history of CSA (no longer on BiPAP therapy).  He is accompanied by his wife again today. I last saw him in November 2025, at which time he reported improved back pain.  His MMSE was 29 out of 30 at the time.  He was advised to continue with Sinemet  generic 1-1/2 pills 4 times a day as well as ropinirole  3 mg 3 times daily.    Today, 03/29/2024: He reports flareup of back pain.  His wife supplements the history.  She reports that his back pain flared up in February after a golfing trip during which time he played golf 4 days in a row.  He has been seeing orthopedics at [facility].  I was able to review some records through his electronic chart.  He sees Dr. Allyn Arenas.  He was considered for radiofrequency ablation but he did not do well during the trial with medial branch blockade on 03/27/2024.  He has been diagnosed with scoliosis and arthritis and currently is on a combination of Celebrex and Tylenol  but it does not really help.  He had physical therapy twice.  He had to put his rock steady boxing on hold.  His wife is still worried about his memory function, he is not so much forgetful as opposed to having a couple of instances of confusion which alarmed her.  He does not always sleep well.  He does nap during the day once at least.   The patient's allergies, current medications, family history, past medical history, past social history, past surgical history and problem list were reviewed and updated as appropriate.    Previously:  09/30/2023: He reports better, had interim back issues, about 6 weeks  ago had significant back pain, treated with local heat and local lidocaine  patch, has not seen an orthopedic specialist yet but considering an MRI if he does not get better.  He has actually improved and physical therapy has helped as well.  No falls.  Tries to hydrate well.  Short-term memory still not very good, maybe a little worse even.  Tries to use his hearing aids more consistently but not all the time.  Motor wise is doing well, no new issues.  He takes melatonin 6 mg at bedtime, continues to take levodopa  1-1/2 pills 4 times a day and ropinirole  3 mg 3 times daily.     I saw him on 04/01/2023, at which time he reported feeling fairly stable.  His wife had noticed short-term memory issues which had become worse.  He had had some lower blood pressure values.  He was advised to hydrate better with water.  He was encouraged to use his hearing aids consistently.  He was advised to continue with levodopa  1-1/2 pills 4 times a day and ropinirole  3 mg 3 times daily.     I saw him on 08/20/2022, at which time he was doing fairly well.  He had not fallen but his wife had taken a fall.  He was advised to continue with his medication regimen, levodopa  1-1/2 pills 4 times daily and ropinirole  3 mg 3 times daily.     I saw him  on 02/18/22, at which time he reported that his voice has become weaker and more raspy.  He had no issues with swallowing.  He denied any issues with constipation.  He had noticed worsening tremor.  He was on once daily long-acting ropinirole  8 mg strength.  He was advised to increase his carbidopa -levodopa  to 1-1/2 pills 4 times a day.  He was advised to continue with ropinirole  long-acting 8 mg once daily.  Patient called in late July 2023 reporting that his prescription coverage for ropinirole  long-acting had changed and it has become very expensive.  I suggested that we switch him to ropinirole  IR  3 mg 3 times daily.       I saw him on 08/20/2021, at which time he was reported to  have more active dreams.  He was advised to take melatonin consistently.  He was advised to continue with Sinemet  1 pill 4 times a day and Requip  long-acting once daily.  He was physically active and participated in boxing classes.  He reported more difficulty with his voice.   I saw him on 02/18/2021, at which time he reported doing fairly well, no recent falls, was trying to stay active, was playing golf on a regular basis and also participated in boxing classes 3 days out of the week.  He found these very beneficial.  His wife had noticed more forgetfulness.  He had undergone hernia surgery in January 2022.  He was advised to increase his Sinemet  to 1 pill 4 times daily.  He was advised to continue with ropinirole .  He was advised to increase his melatonin to 6 mg daily.     I saw him on 08/20/2020, at which time he reported feeling well.  He was active and enrolled in rock steady boxing for PD.  He was able to tolerate levodopa  and was taking 1 pill 3 times daily.  He was on once daily long-acting ropinirole  8 mg strength as well.  He reported 1 recent fall without any major injuries thankfully, he fell while playing Frisbee with his grandson.  He was advised to continue with his current medication regimen and follow-up routinely in 6 months.       I saw him on 04/09/2020, at which time he reported lightheadedness upon standing up too quickly.  No recent falls.  He was on Requip  long-acting 8 mg daily but had noticed an increase in his left hand tremor.  His wife noticed that he had become slower.  Cognitively he was stable.  He had stopped using his BiPAP in January 2021 and reported that he felt better off it.  He was advised to continue with long-acting ropinirole  and start Sinemet .     I saw him on 10/16/2019, at which time he was compliant with his BiPAP.  He was able to tolerate Requip .  He wanted to start rock steady boxing for Parkinson's disease. He was advised to continue with Requip  at the  current dose.      I saw him on 06/15/2019, at which time he was compliant with his BiPAP.  He was taking Requip  2 mg twice daily.  He had noticed changes in his voice.  He still had some dream enactment behavior.  His wife also noted that his left side tremor was a little worse.  I suggested we gradually increase the Requip .    Later in August, we increased his Requip  to once daily long-acting 8 mg strength.    I reviewed his BiPAP compliance  data from 09/17/2019 through 10/16/2019 which is a total of 30 days, during which time he used his machine with percent use days greater than 4 hours at nearly 90%, 87%, indicating excellent compliance with an average usage of 4 hours and 27 minutes, residual AHI at goal at 1.5/h, leak acceptable with the 95th percentile at 14 L/min with a pressure of 12/7 with a backup rate of 14.     I saw him on 02/09/2019 in virtual visit, at which time he reported Doing well, no dream enactment slightly, speech had improved since starting Requip .  He was tolerating it, was taking 2 mg twice daily.  He was physically active.  He did not have any recent constipation issues.  He was struggling a little bit with the BiPAP but overall was motivated to continue.  His wife felt that the Requip  had made a significant difference.   I reviewed his BiPAP ST compliance data from 05/16/2019 through 06/14/2019 which is a total of 30 days, during which time he used his machine 26 nights, with percent used days greater than 4 hours at 73%, indicating adequate compliance with an average usage of 4 hours and 31 minutes, residual AHI at goal at 2.8/h, leak acceptable with a 95th percentile at 12.1 L/min on a pressure of 12/7 with a rate of 14.     I saw him on 12/06/2018, at which time we talked about his recent brain SPECT scan, DaT scan, from 12/01/2018 which showed decreased radiotracer activity within the posterior left and right striata. I suggested we start him on ropinirole  with gradual increase  and I made a referral to neuro rehabilitation. We also talked about utilizing coenzyme Q10 over-the-counter as a supplement. We also talked about his sleep study results.   He had had a baseline sleep study in December 2019 which showed central sleep apnea, he had a BiPAP titration study in January 2020. His second sleep study showed some changes in keeping with REM behavior disorder.   I reviewed his BiPAP compliance data from 01/03/2019 through 02/01/2019 which is a total of 30 days, during which time he used his BiPAP every night with percent used days greater than 4 hours at 87%, indicating very good compliance with an average usage of 4 hours and 36 minutes, residual AHI at goal at 1.7 per hour, leak acceptable with the 95th percentile at 7.5 L/m on a pressure of 12/7 with a backup rate of 14.   I first met him on 09/21/2018 at the request of his primary care physician, at which time he reported changes in his gait and posture. He also reported a 3+ year history of vivid dreams and dream enactment behavior. His exam showed evidence of parkinsonism with left-sided lateralization. I suggested further workup for parkinsonism including a DaT scan, and sleep study testing.    He had a nuclear medicine DaT scan on 12/01/2018 and I reviewed the results: IMPRESSION: Decreased radiotracer activity (dopamine transporter populations) within the posterior LEFT and RIGHT striata (putamen) is a pattern suggestive of Parkinson's syndrome pathology.   We called him with his test results.    His baseline sleep study in December 2019 showed moderate primary central sleep apnea. He was advised to return for a BiPAP titration study, which he had in January 2020. During the second sleep study also had some REM related increase in muscle tone and vocalization, in keeping with his history of REM behavior disorder.   09/21/2018: (He) has had some recent  changes in his gait and posture. He has had an intermittent  tremor, he does not notice his motor changes or posture changes himself but his kids have noticed it and wife has noticed it. He is more concerned about his sleep disturbance. His wife reports that for the past 3+ years he has had intermittent vivid dreams and erratic behaviors and dreams including movements, twitching, and even more dramatic movements such as grabbing her. She sometimes gets scared because of the unpredictable behaviors in his sleep. They still sleep in the same bed together. They have been married 43 years. She has noticed increase in snoring and sometimes apneic pauses while he is asleep. He sometimes reports vivid dreams to her including fighting with an animal but typically does not have recollection of his dreams. He has a history of Parkinson's disease in paternal grandmother. His own mother lived to be 66 and had trembling or parkinsonism in her 15s. Of note, their son who is currently 73 was diagnosed with essential tremor when he was a child, maybe as young as 76 years old, no significant progression in the tremor and son has not been on any symptomatic treatment for this. Cognitively, patient is fine. He retired last year, he was a Clinical research associate. He is very active physically and socially. He likes to golf about 3 days out of the week. They have 3 children, son is the oldest, they have 1 daughter in [place] and one in [place], [place] ; she has 2 kids, ages 48 and 8. I reviewed your office note from 09/09/2018, which you kindly included. His Epworth sleepiness score is 8 out of 24 today, fatigue score is 21 out of 63. He is particularly worried about his sleep disturbance. He is married and lives with his wife, he is a nonsmoker, retired, does not utilize alcohol or caffeine on a regular basis.    His Past Medical History Is Significant For: Past Medical History:  Diagnosis Date   Allergic rhinitis    Back pain    lumbar   Hearing loss    Hypercholesterolemia     His Past Surgical  History Is Significant For: Past Surgical History:  Procedure Laterality Date   VASECTOMY      His Family History Is Significant For: Family History  Problem Relation Age of Onset   Parkinson's disease Mother     His Social History Is Significant For: Social History   Socioeconomic History   Marital status: Married    Spouse name: Not on file   Number of children: Not on file   Years of education: Not on file   Highest education level: Not on file  Occupational History   Not on file  Tobacco Use   Smoking status: Never   Smokeless tobacco: Never  Vaping Use   Vaping status: Never Used  Substance and Sexual Activity   Alcohol use: Yes    Alcohol/week: 2.0 standard drinks of alcohol    Types: 2 Cans of beer per week   Drug use: Not Currently   Sexual activity: Not on file  Other Topics Concern   Not on file  Social History Narrative   Not on file   Social Drivers of Health   Financial Resource Strain: Not on file  Food Insecurity: Not on file  Transportation Needs: Not on file  Physical Activity: Not on file  Stress: Not on file  Social Connections: Not on file    His Allergies Are:  No Known Allergies:  His Current Medications Are:  Outpatient Encounter Medications as of 03/29/2024  Medication Sig   acetaminophen  (TYLENOL ) 500 MG tablet Take 500 mg by mouth.   carbidopa -levodopa  (SINEMET  IR) 25-100 MG tablet Take 1.5 tablets by mouth 4 (four) times daily. Take at 6 AM, 10 AM and 2 PM and 6 PM daily   celecoxib (CELEBREX) 200 MG capsule Take 200 mg by mouth.   fluticasone (FLONASE) 50 MCG/ACT nasal spray Place into both nostrils daily.   Melatonin 3 MG TABS Take 6 mg by mouth at bedtime.   Multiple Vitamins-Minerals (ICAPS AREDS 2 PO) Take by mouth.   Multiple Vitamins-Minerals (MULTIVITAMIN WITH MINERALS) tablet Take 1 tablet by mouth daily.   Omega-3 Fatty Acids (FISH OIL) 1000 MG CAPS Take by mouth.   rOPINIRole  (REQUIP ) 3 MG tablet Take 1 tablet (3 mg  total) by mouth in the morning, at noon, and at bedtime.   rosuvastatin (CRESTOR) 10 MG tablet Take 10 mg by mouth at bedtime.   No facility-administered encounter medications on file as of 03/29/2024.  :  Review of Systems:  Out of a complete 14 point review of systems, all are reviewed and negative with the exception of these symptoms as listed below:  Review of Systems  Neurological:        RM 9 Parkinson's/ Memory Pt is here with his Wife. Pt's wife states that pt has been having back trouble. Pt's wife states that pt's tremors have been okay since his last appointment. Pt's Wife states that she had a pork tenderloin and asked her husband to open it and pt started cutting the meat through the plastic. Pt's Wife states that their air went out and they had gotten it fixed and pt said that he wonders if the fish in the pond had cooled off, but the problem was in the house not outside in the pond.     Objective:  Neurological Exam  Physical Exam Physical Examination:   Vitals:   03/29/24 1055  BP: 119/66  Pulse: 67    General Examination: The patient is a very pleasant 73 y.o. male in no acute distress. He appears well-developed and well-nourished and well groomed.   HEENT: Normocephalic, atraumatic, pupils are equal, round and reactive to light, tracking mildly impaired, corrective eyeglasses in place.  Hearing grossly intact, hearing aids in place bilaterally.  Status post cataract repairs bilaterally.  Face is symmetric with moderate facial masking, intermittently lower jaw tremor noted, moderate nuchal rigidity.  Tongue protrudes centrally and palate elevates symmetrically.  Speech without dysarthria but he does have a raspy and at times higher pitched voice and moderate hypophonia, overall stable.  No significant mouth dryness noted.   Chest: Clear to auscultation without wheezing, rhonchi or crackles noted.   Heart: S1+S2+0, regular and normal without murmurs, rubs or gallops  noted.    Abdomen: Soft, non-tender and non-distended.   Extremities: There is no pitting edema in the distal lower extremities bilaterally.   Skin: Warm and dry without trophic changes noted.   Musculoskeletal: exam reveals low back pain, wearing a Velcro brace.      Neurologically:  Mental status: The patient is awake, alert and oriented in all 4 spheres. His immediate and remote memory, attention, language skills and fund of knowledge are appropriate. There is no evidence of aphasia, agnosia, apraxia or anomia. Speech with to moderate mild raspiness and mild to moderate hypophonia. Thought process is linear. Mood is normal and affect is normal. Good spirits.  09/30/2023   10:27 AM 08/20/2021   10:47 AM  MMSE - Mini Mental State Exam  Orientation to time 4 5  Orientation to Place 5 5  Registration 3 3  Attention/ Calculation 5 5  Recall 3 3  Language- name 2 objects 2 2  Language- repeat 1 1  Language- follow 3 step command 3 3  Language- read & follow direction 1 1  Write a sentence 1 1  Copy design 1 1  Total score 29 30      On 09/30/2023: CDT: 4/4, AFT: 13/min.     On 08/20/2021: CDT: 4/4, AFT: 20/min.   Cranial nerves II - XII are as described above under HEENT exam. In addition: shoulder shrug is unequal with left shoulder slightly higher than right, seems stable.   Motor exam: Normal bulk, strength and tone with the exception of mild cogwheeling in the left upper extremity, slight cogwheeling in the right upper extremity also. He has an intermittent mild resting tremor in the left upper extremity no other resting tremor noted today. No obvious dyskinesias.  Overall mild to moderate bradykinesia, stable.      (On 09/21/2018:on Archimedes spiral drawing he has no significant difficulty with his right hand which is his dominant hand, he has mild insecurity but no obvious trembling with the left hand, handwriting is legible, not particularly tremulous, but on the  smaller side.)   He has no significant postural or action tremor, no intention tremor. On fine motor testing he has mild difficulty on the right, and more moderate findings on the left.  Mild to moderate bradykinesia.  Cerebellar testing: No dysmetria or intention tremor on finger to nose testing. There is no truncal or gait ataxia.  Sensory exam: intact to light touch in the upper and lower extremities.  Gait, station and balance: He stands without difficulty and posture is mildly stooped for age. He walks with decreased stride length and pace and decreased arm swing, no walking aid.   Assessment and Plan:    In summary, David Krueger is a very pleasant 73 year old right-handed gentleman with an underlying medical history of allergic rhinitis, hyperlipidemia, scoliosis, lumbar spondylosis, left macular degeneration, and anemia, who presents for follow-up consultation of his Parkinson's disease, complicated by scoliosis, memory loss, LBP, RBD and history of CSA (no longer on BiPAP therapy). Memory scores were good in October 2022 and slightly worse in November 2024.  He has been using hearing aids since August 2023.    He has a pain and was considered for radiofrequency ablation but may not be a candidate.    He is advised to follow-up with his orthopedic surgeon, he is currently on Celebrex and Tylenol  daily.  He also uses lidocaine  patches and has been through physical therapy.  He is advised to continue with  Sinemet  1-1/2 pills 4 times daily, we switched him from ropinirole  long-acting 8 mg once daily to 3 mg 3 times daily in July 2023 (due to cost).     He could not tolerate BiPAP and eventually stopped using the machine in January 2021, felt better without it.    He had a DaTscan  on 12/01/2018 which showed findings supportive of an underlying parkinsonian syndrome. His history and prior sleep study results were supportive of REM behavior disorder as well.    He was found to have central  sleep apnea and has started BiPAP therapy for this in January 2020. He was compliant with his BiPAP ST, but stopped using BiPAP  in January 2021.    He was on Requip  long-acting for his Parkinson's disease and we increased it to 6 mg daily in August 2020 and then further to 8 mg. We then switched to generic immediate release ropinirole  3 mg 3 times daily in July 2023.      He had hernia surgery in January 2022 with bilateral repair.   We will start low-dose rivastigmine 1.5 mg twice daily for his memory.  We talked about expectations and common side effects today.  We will plan to increase it in about a month, they are encouraged to call or message through MyChart for an update so we can consider increasing the generic Exelon to 3 mg twice daily at the time.  He is advised to follow-up routinely in our clinic to see one of our nurse practitioners in 6 months.    I answered all their questions today to the patient and his wife were in agreement.  I spent 40 minutes in total face-to-face time and in reviewing records during pre-charting, more than 50% of which was spent in counseling and coordination of care, reviewing test results, reviewing medications and treatment regimen and/or in discussing or reviewing the diagnosis of PD, the prognosis and treatment options. Pertinent laboratory and imaging test results that were available during this visit with the patient were reviewed by me and considered in my medical decision making (see chart for details).

## 2024-04-20 DIAGNOSIS — Z6823 Body mass index (BMI) 23.0-23.9, adult: Secondary | ICD-10-CM | POA: Diagnosis not present

## 2024-04-20 DIAGNOSIS — L719 Rosacea, unspecified: Secondary | ICD-10-CM | POA: Diagnosis not present

## 2024-04-28 DIAGNOSIS — M545 Low back pain, unspecified: Secondary | ICD-10-CM | POA: Diagnosis not present

## 2024-04-28 DIAGNOSIS — G8929 Other chronic pain: Secondary | ICD-10-CM | POA: Diagnosis not present

## 2024-04-28 DIAGNOSIS — M47816 Spondylosis without myelopathy or radiculopathy, lumbar region: Secondary | ICD-10-CM | POA: Diagnosis not present

## 2024-05-13 ENCOUNTER — Other Ambulatory Visit: Payer: Self-pay | Admitting: Neurology

## 2024-05-17 ENCOUNTER — Other Ambulatory Visit: Payer: Self-pay | Admitting: *Deleted

## 2024-06-02 DIAGNOSIS — M48061 Spinal stenosis, lumbar region without neurogenic claudication: Secondary | ICD-10-CM | POA: Diagnosis not present

## 2024-06-02 DIAGNOSIS — M47816 Spondylosis without myelopathy or radiculopathy, lumbar region: Secondary | ICD-10-CM | POA: Diagnosis not present

## 2024-06-07 DIAGNOSIS — M545 Low back pain, unspecified: Secondary | ICD-10-CM | POA: Diagnosis not present

## 2024-06-09 ENCOUNTER — Other Ambulatory Visit: Payer: Self-pay | Admitting: Neurology

## 2024-06-13 DIAGNOSIS — M545 Low back pain, unspecified: Secondary | ICD-10-CM | POA: Diagnosis not present

## 2024-06-15 DIAGNOSIS — M545 Low back pain, unspecified: Secondary | ICD-10-CM | POA: Diagnosis not present

## 2024-06-29 DIAGNOSIS — M545 Low back pain, unspecified: Secondary | ICD-10-CM | POA: Diagnosis not present

## 2024-07-03 DIAGNOSIS — M545 Low back pain, unspecified: Secondary | ICD-10-CM | POA: Diagnosis not present

## 2024-07-06 DIAGNOSIS — M545 Low back pain, unspecified: Secondary | ICD-10-CM | POA: Diagnosis not present

## 2024-07-11 DIAGNOSIS — M545 Low back pain, unspecified: Secondary | ICD-10-CM | POA: Diagnosis not present

## 2024-07-14 DIAGNOSIS — M545 Low back pain, unspecified: Secondary | ICD-10-CM | POA: Diagnosis not present

## 2024-07-16 ENCOUNTER — Other Ambulatory Visit: Payer: Self-pay | Admitting: Neurology

## 2024-07-18 DIAGNOSIS — M545 Low back pain, unspecified: Secondary | ICD-10-CM | POA: Diagnosis not present

## 2024-07-21 DIAGNOSIS — M545 Low back pain, unspecified: Secondary | ICD-10-CM | POA: Diagnosis not present

## 2024-07-25 DIAGNOSIS — M545 Low back pain, unspecified: Secondary | ICD-10-CM | POA: Diagnosis not present

## 2024-08-07 DIAGNOSIS — M545 Low back pain, unspecified: Secondary | ICD-10-CM | POA: Diagnosis not present

## 2024-08-10 DIAGNOSIS — M545 Low back pain, unspecified: Secondary | ICD-10-CM | POA: Diagnosis not present

## 2024-08-16 DIAGNOSIS — M545 Low back pain, unspecified: Secondary | ICD-10-CM | POA: Diagnosis not present

## 2024-08-23 DIAGNOSIS — M545 Low back pain, unspecified: Secondary | ICD-10-CM | POA: Diagnosis not present

## 2024-09-06 DIAGNOSIS — M545 Low back pain, unspecified: Secondary | ICD-10-CM | POA: Diagnosis not present

## 2024-09-11 ENCOUNTER — Telehealth: Payer: Self-pay | Admitting: Neurology

## 2024-09-11 NOTE — Telephone Encounter (Signed)
 Pt wife called to request for Pt to  be seen sooner the patient wife states that Pt is more confused and is having trouble with standing. PT wife is not sure what to do . Pt wife would like to speak to Nurse about what Pt is going through

## 2024-09-11 NOTE — Telephone Encounter (Signed)
 I called pt then spoke to wife.  He has declined she feels cognitively.  He continues with rock steady,  PT, taking lidocaine  patches, celebrex , tylenol  for back pain.  He continues playing golf.  Has hard time getting pills out of pill box.  I relayed that seeing pcp for eval for any infection/metabolic processes to rule out that can show decline.  She appreciated that , and if needed prior to seeing us  will do so.  Made 11-15-2023 at 0900 arrive 0830 for mmse.  She was appreciative.

## 2024-09-13 DIAGNOSIS — M545 Low back pain, unspecified: Secondary | ICD-10-CM | POA: Diagnosis not present

## 2024-09-14 ENCOUNTER — Ambulatory Visit: Admitting: Adult Health

## 2024-09-14 ENCOUNTER — Encounter: Payer: Self-pay | Admitting: Adult Health

## 2024-09-14 VITALS — BP 102/71 | HR 99 | Ht 64.0 in | Wt 154.6 lb

## 2024-09-14 DIAGNOSIS — G20A2 Parkinson's disease without dyskinesia, with fluctuations: Secondary | ICD-10-CM

## 2024-09-14 DIAGNOSIS — R413 Other amnesia: Secondary | ICD-10-CM

## 2024-09-14 NOTE — Progress Notes (Signed)
 I reviewed the above note and documentation by the Nurse Practitioner and agree with the history, exam, assessment and plan as outlined above. I was available for consultation. Debbra Fairy, MD, PhD [facility])

## 2024-09-14 NOTE — Patient Instructions (Addendum)
 Your Plan:  Blood work today- CBC, CMP,UA Certainly if back pain or abdominal pain worsens before your appointment next week you should go to urgent care or the ED   Thank you for coming to see us  at [facility]. I hope we have been able to provide you high quality care today.  You may receive a patient satisfaction survey over the next few weeks. We would appreciate your feedback and comments so that we may continue to improve ourselves and the health of our patients.

## 2024-09-14 NOTE — Progress Notes (Signed)
 PATIENT: David Krueger DOB: 1951/04/26  REASON FOR VISIT: follow up HISTORY FROM: patient PRIMARY NEUROLOGIST: Dr. Buck  Chief Complaint  Patient presents with   Follow-up    Pt in 19 with wife Pt here for Parkinson and memory f/u Pt and wife states short term memory has declined Wife states pt has increased lower back pain      HISTORY OF PRESENT ILLNESS: Today 09/14/24   David Krueger is a 73 y.o. male who has been followed in this office for Parkinson's disease and memory disturbance. Returns today for follow-up.  Patient and his wife schedule a sooner appointment because they noticed more changes with his memory.  She states that over the last year she has noticed more episodes of confusion.  Patient states that he loses his train of thought very easily.  His wife states that for example she was cooking dinner 1 night and asked him to take the meat out of the bag so we could cook.  She states that she looked over and he was slicing the meat through the back.  On another occasion she has some to go turn the dryer off and after he been in the overall he stated that he did not know how to turn it off and was instead turning off the washer machine.  At home he is able to complete all ADLs independently.  He manages his own finances.  Wife assists with medications and appointments.  In regards to his Parkinson's he feels that his symptoms are relatively stable.  Remains on Sinemet  4 times a day.  He does continue to have significant back pain and is now having discomfort in the lower abdomen.  Reports that he has an appointment next week to discuss with his PCP.  Returns today for an evaluation.  HISTORY /21/2025: He reports flareup of back pain.  His wife supplements the history.  She reports that his back pain flared up in February after a golfing trip during which time he played golf 4 days in a row.  He has been seeing orthopedics at [facility].  I was able to review some records  through his electronic chart.  He sees Dr. Darlean.  He was considered for radiofrequency ablation but he did not do well during the trial with medial branch blockade on 03/27/2024.  He has been diagnosed with scoliosis and arthritis and currently is on a combination of Celebrex and Tylenol  but it does not really help.  He had physical therapy twice.  He had to put his rock steady boxing on hold.  His wife is still worried about his memory function, he is not so much forgetful as opposed to having a couple of instances of confusion which alarmed her.  He does not always sleep well.  He does nap during the day once at least.     The patient's allergies, current medications, family history, past medical history, past social history, past surgical history and problem list were reviewed and updated as appropriate.    Previously:   09/30/2023: He reports better, had interim back issues, about 6 weeks ago had significant back pain, treated with local heat and local lidocaine  patch, has not seen an orthopedic specialist yet but considering an MRI if he does not get better.  He has actually improved and physical therapy has helped as well.  No falls.  Tries to hydrate well.  Short-term memory still not very good, maybe a little worse even.  Tries  to use his hearing aids more consistently but not all the time.  Motor wise is doing well, no new issues.  He takes melatonin 6 mg at bedtime, continues to take levodopa  1-1/2 pills 4 times a day and ropinirole  3 mg 3 times daily.     I saw him on 04/01/2023, at which time he reported feeling fairly stable.  His wife had noticed short-term memory issues which had become worse.  He had had some lower blood pressure values.  He was advised to hydrate better with water.  He was encouraged to use his hearing aids consistently.  He was advised to continue with levodopa  1-1/2 pills 4 times a day and ropinirole  3 mg 3 times daily.     I saw him on 08/20/2022, at which time he  was doing fairly well.  He had not fallen but his wife had taken a fall.  He was advised to continue with his medication regimen, levodopa  1-1/2 pills 4 times daily and ropinirole  3 mg 3 times daily.     I saw him on 02/18/22, at which time he reported that his voice has become weaker and more raspy.  He had no issues with swallowing.  He denied any issues with constipation.  He had noticed worsening tremor.  He was on once daily long-acting ropinirole  8 mg strength.  He was advised to increase his carbidopa -levodopa  to 1-1/2 pills 4 times a day.  He was advised to continue with ropinirole  long-acting 8 mg once daily.  Patient called in late July 2023 reporting that his prescription coverage for ropinirole  long-acting had changed and it has become very expensive.  I suggested that we switch him to ropinirole  IR  3 mg 3 times daily.       I saw him on 08/20/2021, at which time he was reported to have more active dreams.  He was advised to take melatonin consistently.  He was advised to continue with Sinemet  1 pill 4 times a day and Requip  long-acting once daily.  He was physically active and participated in boxing classes.  He reported more difficulty with his voice.   I saw him on 02/18/2021, at which time he reported doing fairly well, no recent falls, was trying to stay active, was playing golf on a regular basis and also participated in boxing classes 3 days out of the week.  He found these very beneficial.  His wife had noticed more forgetfulness.  He had undergone hernia surgery in January 2022.  He was advised to increase his Sinemet  to 1 pill 4 times daily.  He was advised to continue with ropinirole .  He was advised to increase his melatonin to 6 mg daily.     I saw him on 08/20/2020, at which time he reported feeling well.  He was active and enrolled in rock steady boxing for PD.  He was able to tolerate levodopa  and was taking 1 pill 3 times daily.  He was on once daily long-acting ropinirole  8 mg  strength as well.  He reported 1 recent fall without any major injuries thankfully, he fell while playing Frisbee with his grandson.  He was advised to continue with his current medication regimen and follow-up routinely in 6 months.       I saw him on 04/09/2020, at which time he reported lightheadedness upon standing up too quickly.  No recent falls.  He was on Requip  long-acting 8 mg daily but had noticed an increase in his left hand  tremor.  His wife noticed that he had become slower.  Cognitively he was stable.  He had stopped using his BiPAP in January 2021 and reported that he felt better off it.  He was advised to continue with long-acting ropinirole  and start Sinemet .     I saw him on 10/16/2019, at which time he was compliant with his BiPAP.  He was able to tolerate Requip .  He wanted to start rock steady boxing for Parkinson's disease. He was advised to continue with Requip  at the current dose.      I saw him on 06/15/2019, at which time he was compliant with his BiPAP.  He was taking Requip  2 mg twice daily.  He had noticed changes in his voice.  He still had some dream enactment behavior.  His wife also noted that his left side tremor was a little worse.  I suggested we gradually increase the Requip .    Later in August, we increased his Requip  to once daily long-acting 8 mg strength.    I reviewed his BiPAP compliance data from 09/17/2019 through 10/16/2019 which is a total of 30 days, during which time he used his machine with percent use days greater than 4 hours at nearly 90%, 87%, indicating excellent compliance with an average usage of 4 hours and 27 minutes, residual AHI at goal at 1.5/h, leak acceptable with the 95th percentile at 14 L/min with a pressure of 12/7 with a backup rate of 14.     I saw him on 02/09/2019 in virtual visit, at which time he reported Doing well, no dream enactment slightly, speech had improved since starting Requip .  He was tolerating it, was taking 2 mg twice  daily.  He was physically active.  He did not have any recent constipation issues.  He was struggling a little bit with the BiPAP but overall was motivated to continue.  His wife felt that the Requip  had made a significant difference.   I reviewed his BiPAP ST compliance data from 05/16/2019 through 06/14/2019 which is a total of 30 days, during which time he used his machine 26 nights, with percent used days greater than 4 hours at 73%, indicating adequate compliance with an average usage of 4 hours and 31 minutes, residual AHI at goal at 2.8/h, leak acceptable with a 95th percentile at 12.1 L/min on a pressure of 12/7 with a rate of 14.     I saw him on 12/06/2018, at which time we talked about his recent brain SPECT scan, DaT scan, from 12/01/2018 which showed decreased radiotracer activity within the posterior left and right striata. I suggested we start him on ropinirole  with gradual increase and I made a referral to neuro rehabilitation. We also talked about utilizing coenzyme Q10 over-the-counter as a supplement. We also talked about his sleep study results.   He had had a baseline sleep study in December 2019 which showed central sleep apnea, he had a BiPAP titration study in January 2020. His second sleep study showed some changes in keeping with REM behavior disorder.   I reviewed his BiPAP compliance data from 01/03/2019 through 02/01/2019 which is a total of 30 days, during which time he used his BiPAP every night with percent used days greater than 4 hours at 87%, indicating very good compliance with an average usage of 4 hours and 36 minutes, residual AHI at goal at 1.7 per hour, leak acceptable with the 95th percentile at 7.5 L/m on a pressure of 12/7 with  a backup rate of 14.   I first met him on 09/21/2018 at the request of his primary care physician, at which time he reported changes in his gait and posture. He also reported a 3+ year history of vivid dreams and dream enactment behavior. His  exam showed evidence of parkinsonism with left-sided lateralization. I suggested further workup for parkinsonism including a DaT scan, and sleep study testing.    He had a nuclear medicine DaT scan on 12/01/2018 and I reviewed the results: IMPRESSION: Decreased radiotracer activity (dopamine transporter populations) within the posterior LEFT and RIGHT striata (putamen) is a pattern suggestive of Parkinson's syndrome pathology.   We called him with his test results.    His baseline sleep study in December 2019 showed moderate primary central sleep apnea. He was advised to return for a BiPAP titration study, which he had in January 2020. During the second sleep study also had some REM related increase in muscle tone and vocalization, in keeping with his history of REM behavior disorder.   09/21/2018: (He) has had some recent changes in his gait and posture. He has had an intermittent tremor, he does not notice his motor changes or posture changes himself but his kids have noticed it and wife has noticed it. He is more concerned about his sleep disturbance. His wife reports that for the past 3+ years he has had intermittent vivid dreams and erratic behaviors and dreams including movements, twitching, and even more dramatic movements such as grabbing her. She sometimes gets scared because of the unpredictable behaviors in his sleep. They still sleep in the same bed together. They have been married 43 years. She has noticed increase in snoring and sometimes apneic pauses while he is asleep. He sometimes reports vivid dreams to her including fighting with an animal but typically does not have recollection of his dreams. He has a history of Parkinson's disease in paternal grandmother. His own mother lived to be 37 and had trembling or parkinsonism in her 103s. Of note, their son who is currently 64 was diagnosed with essential tremor when he was a child, maybe as young as 50 years old, no significant progression  in the tremor and son has not been on any symptomatic treatment for this. Cognitively, patient is fine. He retired last year, he was a clinical research associate. He is very active physically and socially. He likes to golf about 3 days out of the week. They have 3 children, son is the oldest, they have 1 daughter in [place] and one in [place], [place] ; she has 2 kids, ages 43 and 20. I reviewed your office note from 09/09/2018, which you kindly included. His Epworth sleepiness score is 8 out of 24 today, fatigue score is 21 out of 63. He is particularly worried about his sleep disturbance. He is married and lives with his wife, he is a nonsmoker, retired, does not utilize alcohol or caffeine on a regular basis.      REVIEW OF SYSTEMS: Out of a complete 14 system review of symptoms, the patient complains only of the following symptoms, and all other reviewed systems are negative.   Listed in HPI  ALLERGIES: No Known Allergies  HOME MEDICATIONS: Outpatient Medications Prior to Visit  Medication Sig Dispense Refill   acetaminophen  (TYLENOL ) 500 MG tablet Take 500 mg by mouth.     carbidopa -levodopa  (SINEMET  IR) 25-100 MG tablet Take 1.5 tablets by mouth 4 (four) times daily. Take at 6 AM, 10 AM and 2 PM and 6  PM daily 540 tablet 1   celecoxib (CELEBREX) 200 MG capsule Take 200 mg by mouth.     fluticasone (FLONASE) 50 MCG/ACT nasal spray Place into both nostrils daily.     Melatonin 3 MG TABS Take 6 mg by mouth at bedtime.     Multiple Vitamins-Minerals (ICAPS AREDS 2 PO) Take by mouth.     Multiple Vitamins-Minerals (MULTIVITAMIN WITH MINERALS) tablet Take 1 tablet by mouth daily.     Omega-3 Fatty Acids (FISH OIL) 1000 MG CAPS Take by mouth.     rivastigmine  (EXELON ) 1.5 MG capsule Take 1 capsule (1.5 mg total) by mouth 2 (two) times daily. 60 capsule 3   rOPINIRole  (REQUIP ) 3 MG tablet Take 1 tablet (3 mg total) by mouth in the morning, at noon, and at bedtime. 270 tablet 3   rosuvastatin (CRESTOR) 10 MG tablet  Take 10 mg by mouth at bedtime.     No facility-administered medications prior to visit.    PAST MEDICAL HISTORY: Past Medical History:  Diagnosis Date   Allergic rhinitis    Back pain    lumbar   Hearing loss    Hypercholesterolemia     PAST SURGICAL HISTORY: Past Surgical History:  Procedure Laterality Date   VASECTOMY      FAMILY HISTORY: Family History  Problem Relation Age of Onset   Parkinson's disease Mother    Dementia Paternal Grandmother     SOCIAL HISTORY: Social History   Socioeconomic History   Marital status: Married    Spouse name: Not on file   Number of children: Not on file   Years of education: Not on file   Highest education level: Not on file  Occupational History   Not on file  Tobacco Use   Smoking status: Never   Smokeless tobacco: Never  Vaping Use   Vaping status: Never Used  Substance and Sexual Activity   Alcohol use: Yes    Alcohol/week: 2.0 standard drinks of alcohol    Types: 2 Cans of beer per week   Drug use: Not Currently   Sexual activity: Not on file  Other Topics Concern   Not on file  Social History Narrative   Lives with wife    Retired    Chief Executive Officer Drivers of Corporate Investment Banker Strain: Low Risk  (04/28/2024)   Received from [facility]   Overall Financial Resource Strain (CARDIA)    Difficulty of Paying Living Expenses: Not hard at all  Food Insecurity: No Food Insecurity (04/28/2024)   Received from [facility]   Hunger Vital Sign    Within the past 12 months, you worried that your food would run out before you got the money to buy more.: Never true    Within the past 12 months, the food you bought just didn't last and you didn't have money to get more.: Never true  Transportation Needs: No Transportation Needs (04/28/2024)   Received from [facility] - Transportation    Lack of Transportation (Medical): No    Lack of Transportation (Non-Medical): No  Physical Activity: Not on file   Stress: Not on file  Social Connections: Not on file  Intimate Partner Violence: Not on file      PHYSICAL EXAM  Vitals:   09/14/24 0852  BP: 102/71  Pulse: 99  Weight: 154 lb 9.6 oz (70.1 kg)  Height: 5' 4 (1.626 m)   Body mass index is 26.54 kg/m.     09/14/2024  8:54 AM 09/30/2023   10:27 AM 08/20/2021   10:47 AM  MMSE - Mini Mental State Exam  Orientation to time 5 4 5   Orientation to Place 1 5 5   Registration 3 3 3   Attention/ Calculation 4 5 5   Recall 3 3 3   Language- name 2 objects 2 2 2   Language- repeat 1 1 1   Language- follow 3 step command 3 3 3   Language- read & follow direction 1 1 1   Write a sentence 1 1 1   Copy design 1 1 1   Total score 25 29 30      Generalized: Well developed, in no acute distress   Neurological examination  Mentation: Alert oriented to time, place, history taking. Follows all commands speech and language fluent Cranial nerve II-XII: Pupils were equal round reactive to light. Extraocular movements were full, visual field were full on confrontational test. Facial sensation and strength were normal.  Motor: The motor testing reveals 5 over 5 strength of all 4 extremities. Good symmetric motor tone is noted throughout.  Sensory: Sensory testing is intact to soft touch on all 4 extremities. No evidence of extinction is noted.  Coordination: Cerebellar testing reveals good finger-nose-finger and heel-to-shin bilaterally.  Gait and station: Gait is normal.  Reflexes: Deep tendon reflexes are symmetric and normal bilaterally.   DIAGNOSTIC DATA (LABS, IMAGING, TESTING) - I reviewed patient records, labs, notes, testing and imaging myself where available.   ASSESSMENT AND PLAN 73 y.o. year old male  has a past medical history of Allergic rhinitis, Back pain, Hearing loss, and Hypercholesterolemia. here with:  Short-term memory loss Parkinson's disease Back pain   Advised that we will check blood work CBC CMP and a urinalysis  to rule out any other underlying causes of increased memory trouble. If blood work is relatively unremarkable we will increase Exelon  to 3 mg twice a day Encouraged to keep appointment next week with his PCP.  Certainly if the back pain or abdominal pain gets significantly worse he should go to urgent care or the ED. Continue Sinemet  1-1/2 tablets 4 times a day Continue Requip  3 mg 3 times a day    Orders Placed This Encounter  Procedures   Comprehensive metabolic panel with GFR   CBC with Differential/Platelet   Urinalysis, Routine w reflex microscopic     Duwaine Russell, MSN, NP-C 09/14/2024, 10:01 AM [facility] [address], [address] [place], [place] [address] [phone]  The patient's condition requires frequent monitoring and adjustments in the treatment plan, reflecting the ongoing complexity of care.  This provider is the continuing focal point for all needed services for this condition.

## 2024-09-15 LAB — COMPREHENSIVE METABOLIC PANEL WITH GFR
ALT: 12 IU/L (ref 0–44)
AST: 22 IU/L (ref 0–40)
Albumin: 4.5 g/dL (ref 3.8–4.8)
Alkaline Phosphatase: 129 IU/L — ABNORMAL HIGH (ref 47–123)
BUN/Creatinine Ratio: 30 — ABNORMAL HIGH (ref 10–24)
BUN: 31 mg/dL — ABNORMAL HIGH (ref 8–27)
Bilirubin Total: 0.5 mg/dL (ref 0.0–1.2)
CO2: 24 mmol/L (ref 20–29)
Calcium: 10.2 mg/dL (ref 8.6–10.2)
Chloride: 102 mmol/L (ref 96–106)
Creatinine, Ser: 1.02 mg/dL (ref 0.76–1.27)
Globulin, Total: 2.3 g/dL (ref 1.5–4.5)
Glucose: 78 mg/dL (ref 70–99)
Potassium: 5.2 mmol/L (ref 3.5–5.2)
Sodium: 139 mmol/L (ref 134–144)
Total Protein: 6.8 g/dL (ref 6.0–8.5)
eGFR: 77 mL/min/1.73 (ref >59)

## 2024-09-15 LAB — MICROSCOPIC EXAMINATION
Bacteria, UA: NONE SEEN
Casts: NONE SEEN /LPF
Epithelial Cells (non renal): NONE SEEN /HPF (ref 0–10)
RBC, Urine: >30 /HPF — AB (ref 0–2)
WBC, UA: NONE SEEN /HPF (ref 0–5)

## 2024-09-15 LAB — URINALYSIS, ROUTINE W REFLEX MICROSCOPIC
Glucose, UA: NEGATIVE
Nitrite, UA: NEGATIVE
Specific Gravity, UA: >=1.03 — AB (ref 1.005–1.030)
Urobilinogen, Ur: 1 mg/dL (ref 0.2–1.0)
pH, UA: 5.5 (ref 5.0–7.5)

## 2024-09-15 LAB — CBC WITH DIFFERENTIAL/PLATELET
Basophils Absolute: 0 x10E3/uL (ref 0.0–0.2)
Basos: 1 %
EOS (ABSOLUTE): 0.1 x10E3/uL (ref 0.0–0.4)
Eos: 1 %
Hematocrit: 47.1 % (ref 37.5–51.0)
Hemoglobin: 15.9 g/dL (ref 13.0–17.7)
Immature Grans (Abs): 0 x10E3/uL (ref 0.0–0.1)
Immature Granulocytes: 0 %
Lymphocytes Absolute: 1.1 x10E3/uL (ref 0.7–3.1)
Lymphs: 17 %
MCH: 33.3 pg — ABNORMAL HIGH (ref 26.6–33.0)
MCHC: 33.8 g/dL (ref 31.5–35.7)
MCV: 99 fL — ABNORMAL HIGH (ref 79–97)
Monocytes Absolute: 0.7 x10E3/uL (ref 0.1–0.9)
Monocytes: 10 %
Neutrophils Absolute: 4.7 x10E3/uL (ref 1.4–7.0)
Neutrophils: 71 %
Platelets: 226 x10E3/uL (ref 150–450)
RBC: 4.78 x10E6/uL (ref 4.14–5.80)
RDW: 11.5 % — ABNORMAL LOW (ref 11.6–15.4)
WBC: 6.6 x10E3/uL (ref 3.4–10.8)

## 2024-09-18 ENCOUNTER — Ambulatory Visit: Payer: Self-pay | Admitting: Adult Health

## 2024-09-18 NOTE — Telephone Encounter (Signed)
 Late entry: I called the patient 09/17/24 and spoke to his wife.  Explained that his urinalysis was abnormal.  It does not appear that he has a urinary tract infection.  Possible kidney stones?  As crystals was seen on the microscopic exam.  His symptoms have been ongoing since February March.  I did advise that if he has any new or worsening symptoms or trouble urinating he should go to urgent care.  Advised that I would send his results to his primary care today.  He has an appointment with him on Wednesday.

## 2024-09-20 DIAGNOSIS — Z6823 Body mass index (BMI) 23.0-23.9, adult: Secondary | ICD-10-CM | POA: Diagnosis not present

## 2024-09-20 DIAGNOSIS — R10A0 Flank pain, unspecified side: Secondary | ICD-10-CM | POA: Diagnosis not present

## 2024-09-20 DIAGNOSIS — M419 Scoliosis, unspecified: Secondary | ICD-10-CM | POA: Diagnosis not present

## 2024-09-22 DIAGNOSIS — R109 Unspecified abdominal pain: Secondary | ICD-10-CM | POA: Diagnosis not present

## 2024-09-26 DIAGNOSIS — Z6823 Body mass index (BMI) 23.0-23.9, adult: Secondary | ICD-10-CM | POA: Diagnosis not present

## 2024-09-26 DIAGNOSIS — Z1331 Encounter for screening for depression: Secondary | ICD-10-CM | POA: Diagnosis not present

## 2024-09-26 DIAGNOSIS — M5410 Radiculopathy, site unspecified: Secondary | ICD-10-CM | POA: Diagnosis not present

## 2024-09-26 DIAGNOSIS — Z1339 Encounter for screening examination for other mental health and behavioral disorders: Secondary | ICD-10-CM | POA: Diagnosis not present

## 2024-10-03 DIAGNOSIS — M545 Low back pain, unspecified: Secondary | ICD-10-CM | POA: Diagnosis not present

## 2024-10-09 ENCOUNTER — Telehealth: Payer: Self-pay | Admitting: Adult Health

## 2024-10-09 NOTE — Telephone Encounter (Signed)
 Please get more information.  Please advise patient or his wife that carbidopa  levodopa  does not cause sudden onset of pain, sometimes people have aching on 1 side but if it is severe, I recommend he go to the emergency room and get checked out.  I do not see any recent x-rays on file.  I recommend he see PCP if he has not seen her yet or go to urgent care or emergency room depending on severity and acuteness.  I do see a lumbar spine nerve block with [facility] from May 2025.  He may want to see his spine specialist as well.  I see some encounters recently but cannot click on the note. He had an abnormal urinalysis when we checked it in early November 2025.  Not sure if he was treated for a UTI?  Has he been checked for kidney stone?

## 2024-10-09 NOTE — Telephone Encounter (Signed)
 Patient's wife, Koua Deeg said patient complaining of pain on right side, lower back;  where kidney are, also complaining of knee pain.  Have had test and all test are clear. Want a call back to discuss if this is side effect of carbidopa -levodopa  (SINEMET  IR) 25-100 MG tablet.

## 2024-10-10 NOTE — Telephone Encounter (Addendum)
 Called David Krueger/spouse (on [place]) at [phone]. Relayed Dr. Obie message. States sx started 01/2024. Already had US , Xray, MRI that were ok. Denies any sx of UTI and PCP felt he did not have UTI needing treatment. Has no kidney stones/tumors. oing for more labs tomorrow at PCP office for further evaluation.   He will see Dr. Buck next 11/23/24.

## 2024-10-11 DIAGNOSIS — M5410 Radiculopathy, site unspecified: Secondary | ICD-10-CM | POA: Diagnosis not present

## 2024-10-11 DIAGNOSIS — Z6823 Body mass index (BMI) 23.0-23.9, adult: Secondary | ICD-10-CM | POA: Diagnosis not present

## 2024-10-11 DIAGNOSIS — G20A1 Parkinson's disease without dyskinesia, without mention of fluctuations: Secondary | ICD-10-CM | POA: Diagnosis not present

## 2024-10-11 DIAGNOSIS — M545 Low back pain, unspecified: Secondary | ICD-10-CM | POA: Diagnosis not present

## 2024-11-06 ENCOUNTER — Telehealth: Payer: Self-pay | Admitting: Adult Health

## 2024-11-06 NOTE — Telephone Encounter (Signed)
 Pt's wife returned call. She would like to be called back on [phone].

## 2024-11-06 NOTE — Telephone Encounter (Signed)
 I spoke to the wife and moved their appointment to 11/13/24 with Dr. Buck with her approval. Patient's wife has been informed to call 911 if he has another episode for safety reasons and to make sure all sharp objects and devices are stored away to prevent easy access for the patient.   Patient has been on Prednisone for back issues since march of 2025. He went to urgent care last week and was given prednisone and that is when he started seeing thing. He was not hearing things per the wife. The neighbors stated he thought people were coming to get him when he had the episode and was seeing people in the room that was not there. He has been off the prednisone for 5 days episode free.   He has been taking Exelon  twice a day as directed. He stopped Advil, Baclofen, and Celebrex

## 2024-11-06 NOTE — Telephone Encounter (Signed)
 I called and left a message to discuss medication concerns.
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
## 2024-11-12 ENCOUNTER — Other Ambulatory Visit: Payer: Self-pay | Admitting: Neurology

## 2024-11-13 ENCOUNTER — Ambulatory Visit: Admitting: Neurology

## 2024-11-13 ENCOUNTER — Encounter: Payer: Self-pay | Admitting: Neurology

## 2024-11-13 VITALS — BP 93/63 | HR 106 | Ht 66.0 in | Wt 153.0 lb

## 2024-11-13 DIAGNOSIS — G20A1 Parkinson's disease without dyskinesia, without mention of fluctuations: Secondary | ICD-10-CM | POA: Diagnosis not present

## 2024-11-13 DIAGNOSIS — R413 Other amnesia: Secondary | ICD-10-CM

## 2024-11-13 DIAGNOSIS — G20A2 Parkinson's disease without dyskinesia, with fluctuations: Secondary | ICD-10-CM | POA: Diagnosis not present

## 2024-11-13 DIAGNOSIS — R410 Disorientation, unspecified: Secondary | ICD-10-CM | POA: Diagnosis not present

## 2024-11-13 DIAGNOSIS — F02A2 Dementia in other diseases classified elsewhere, mild, with psychotic disturbance: Secondary | ICD-10-CM | POA: Diagnosis not present

## 2024-11-13 MED ORDER — ROPINIROLE HCL 2 MG PO TABS: 2.0000 mg | ORAL_TABLET | Freq: Three times a day (TID) | ORAL | 3 refills | Status: AC

## 2024-11-13 MED ORDER — CARBIDOPA-LEVODOPA 25-100 MG PO TABS: 2.0000 | ORAL_TABLET | Freq: Four times a day (QID) | ORAL | 3 refills | Status: AC

## 2024-11-13 NOTE — Patient Instructions (Addendum)
 We will reduce the ropinirole  to 2 mg 3 times a day.  We will increase your levodopa  to 2 pills 4 times a day. We may increase your Exelon  next visit to 3 mg twice daily. We may consider for your Parkinson's related hallucinations and delusions a new medication, called Nuplazid, which is specifically approved by the FDA for this indication. Please remember, that elderly patients with dementia related psychosis on treatment with an anti-psychotic medication are, generally speaking, at an increased risk of death. Nevertheless, this medication is usually well tolerated. The most common side effects include (but are not limeted to): Swelling, nausea, confusion, hallucinations, constipation and gait and balance problems. You cannot take this medication if you have a heart condition called prolonged QT, or if you take an antiarrhythmic heart medication, certain anti-psychotic medications and certain anti-biotics such as ketoconazole.  The typical dose for this medication is 17 mg tablets, take 2 pills by mouth once daily, for a total dose of 34 mg once daily. There is no need for titration of this medication and it typically does not need to be adjusted for kidney impairment (when the kidney disease is mild to moderate), but the use of this medication is not recommended in patients with liver function impairment.  5. Increase your water intake to 48 to 64 oz per day.  6. We will do an EEG (brainwave test), which we will schedule. We will call you with the results. 7.  Follow-up in 3 months with me and make an appointment with Megan in about 6 months to 8 months from now.

## 2024-11-13 NOTE — Progress Notes (Signed)
 Subjective:    Patient ID: David Krueger is a 74 y.o. male.  HPI    David Mar, MD, PhD [facility] [address], [address] [address] [place], [place] [address]  David Krueger is a 74 year old right-handed gentleman with an underlying medical history of allergic rhinitis, hyperlipidemia, scoliosis, lumbar spondylosis, left macular degeneration, and anemia, who presents for follow-up consultation of his Parkinson's disease, complicated by scoliosis, memory loss, LBP, RBD and history of CSA (no longer on BiPAP therapy).  He is accompanied by his wife today and daughter today and presents for a sooner than scheduled appointment due to recent behavioral changes.  He was last seen in our clinic by David Eriksson, NP in November 2025, at which time his MMSE was 25.  He was advised to continue with Sinemet  1-1/2 pills 4 times a day and ropinirole  3 mg 3 times daily.  Increase in Exelon  to 3 mg twice daily was discussed.  Urinalysis showed possibility of kidney stones.  His wife called in the interim reporting that he was having delusions and hallucinations.  He had been treated for his back pain with a course of prednisone.  Today, 11/13/2024: He reports very little, history is provided primarily by his wife and supplemented by his daughter.  He reports not recalling the details of the recent episodes of hallucinations and delusions.  His wife reports that on Christmas eve he had a bad episode of seeing people outside or being scared about people coming for them.  He had been out side with a hatchet.  This is when his wife had called us  and was advised about safety and calling 911 if needed.  He has finished prednisone and has been off of it for about 10 days, this was for his back pain.  He is no longer on baclofen and takes lidocaine  patch only as needed, not on any narcotic pain medication or tramadol and no longer on Celebrex.  He has driven around town but not much.  She would  like to see if she can leave him at least 1 evening per week when she has choir practice, daughter is offering to stay with him during that time, daughter and son live fairly close by which is great.  He has not had any acute onset of one-sided weakness or severe headaches but has had some staring spells and confusion and increase in daytime somnolence.  He still has hallucinations, they may not be as frequent since the prednisone has been discontinued.  He was on a 6-day course of a prednisone taper.  The patient's allergies, current medications, family history, past medical history, past social history, past surgical history and problem list were reviewed and updated as appropriate.    Previously:   09/14/24 (MM): <<  David Krueger is a 74 y.o. male who has been followed in this office for Parkinson's disease and memory disturbance. Returns today for follow-up.  Patient and his wife schedule a sooner appointment because they noticed more changes with his memory.  She states that over the last year she has noticed more episodes of confusion.  Patient states that he loses his train of thought very easily.  His wife states that for example she was cooking dinner 1 night and asked him to take the meat out of the bag so we could cook.  She states that she looked over and he was slicing the meat through the back.  On another occasion she has some to  go turn the dryer off and after he been in the overall he stated that he did not know how to turn it off and was instead turning off the washer machine.  At home he is able to complete all ADLs independently.  He manages his own finances.  Wife assists with medications and appointments.  In regards to his Parkinson's he feels that his symptoms are relatively stable.  Remains on Sinemet  4 times a day.  He does continue to have significant back pain and is now having discomfort in the lower abdomen.  Reports that he has an appointment next week to discuss with his  PCP.  Returns today for an evaluation.>>       03/29/2024:  I last saw him in November 2025, at which time he reported improved back pain.  His MMSE was 29 out of 30 at the time.  He was advised to continue with Sinemet  generic 1-1/2 pills 4 times a day as well as ropinirole  3 mg 3 times daily.   He reports flareup of back pain.  His wife supplements the history.  She reports that his back pain flared up in February after a golfing trip during which time he played golf 4 days in a row.  He has been seeing orthopedics at [facility].  I was able to review some records through his electronic chart.  He sees Dr. Darlean.  He was considered for radiofrequency ablation but he did not do well during the trial with medial branch blockade on 03/27/2024.  He has been diagnosed with scoliosis and arthritis and currently is on a combination of Celebrex and Tylenol  but it does not really help.  He had physical therapy twice.  He had to put his rock steady boxing on hold.  His wife is still worried about his memory function, he is not so much forgetful as opposed to having a couple of instances of confusion which alarmed her.  He does not always sleep well.  He does nap during the day once at least.     09/30/2023: He reports better, had interim back issues, about 6 weeks ago had significant back pain, treated with local heat and local lidocaine  patch, has not seen an orthopedic specialist yet but considering an MRI if he does not get better.  He has actually improved and physical therapy has helped as well.  No falls.  Tries to hydrate well.  Short-term memory still not very good, maybe a little worse even.  Tries to use his hearing aids more consistently but not all the time.  Motor wise is doing well, no new issues.  He takes melatonin 6 mg at bedtime, continues to take levodopa  1-1/2 pills 4 times a day and ropinirole  3 mg 3 times daily.     I saw him on 04/01/2023, at which time he reported feeling fairly stable.   His wife had noticed short-term memory issues which had become worse.  He had had some lower blood pressure values.  He was advised to hydrate better with water.  He was encouraged to use his hearing aids consistently.  He was advised to continue with levodopa  1-1/2 pills 4 times a day and ropinirole  3 mg 3 times daily.     I saw him on 08/20/2022, at which time he was doing fairly well.  He had not fallen but his wife had taken a fall.  He was advised to continue with his medication regimen, levodopa  1-1/2 pills 4 times daily and  ropinirole  3 mg 3 times daily.     I saw him on 02/18/22, at which time he reported that his voice has become weaker and more raspy.  He had no issues with swallowing.  He denied any issues with constipation.  He had noticed worsening tremor.  He was on once daily long-acting ropinirole  8 mg strength.  He was advised to increase his carbidopa -levodopa  to 1-1/2 pills 4 times a day.  He was advised to continue with ropinirole  long-acting 8 mg once daily.  Patient called in late July 2023 reporting that his prescription coverage for ropinirole  long-acting had changed and it has become very expensive.  I suggested that we switch him to ropinirole  IR  3 mg 3 times daily.       I saw him on 08/20/2021, at which time he was reported to have more active dreams.  He was advised to take melatonin consistently.  He was advised to continue with Sinemet  1 pill 4 times a day and Requip  long-acting once daily.  He was physically active and participated in boxing classes.  He reported more difficulty with his voice.   I saw him on 02/18/2021, at which time he reported doing fairly well, no recent falls, was trying to stay active, was playing golf on a regular basis and also participated in boxing classes 3 days out of the week.  He found these very beneficial.  His wife had noticed more forgetfulness.  He had undergone hernia surgery in January 2022.  He was advised to increase his Sinemet  to 1  pill 4 times daily.  He was advised to continue with ropinirole .  He was advised to increase his melatonin to 6 mg daily.     I saw him on 08/20/2020, at which time he reported feeling well.  He was active and enrolled in rock steady boxing for PD.  He was able to tolerate levodopa  and was taking 1 pill 3 times daily.  He was on once daily long-acting ropinirole  8 mg strength as well.  He reported 1 recent fall without any major injuries thankfully, he fell while playing Frisbee with his grandson.  He was advised to continue with his current medication regimen and follow-up routinely in 6 months.       I saw him on 04/09/2020, at which time he reported lightheadedness upon standing up too quickly.  No recent falls.  He was on Requip  long-acting 8 mg daily but had noticed an increase in his left hand tremor.  His wife noticed that he had become slower.  Cognitively he was stable.  He had stopped using his BiPAP in January 2021 and reported that he felt better off it.  He was advised to continue with long-acting ropinirole  and start Sinemet .     I saw him on 10/16/2019, at which time he was compliant with his BiPAP.  He was able to tolerate Requip .  He wanted to start rock steady boxing for Parkinson's disease. He was advised to continue with Requip  at the current dose.      I saw him on 06/15/2019, at which time he was compliant with his BiPAP.  He was taking Requip  2 mg twice daily.  He had noticed changes in his voice.  He still had some dream enactment behavior.  His wife also noted that his left side tremor was a little worse.  I suggested we gradually increase the Requip .    Later in August, we increased his Requip  to once daily  long-acting 8 mg strength.    I reviewed his BiPAP compliance data from 09/17/2019 through 10/16/2019 which is a total of 30 days, during which time he used his machine with percent use days greater than 4 hours at nearly 90%, 87%, indicating excellent compliance with an  average usage of 4 hours and 27 minutes, residual AHI at goal at 1.5/h, leak acceptable with the 95th percentile at 14 L/min with a pressure of 12/7 with a backup rate of 14.     I saw him on 02/09/2019 in virtual visit, at which time he reported Doing well, no dream enactment slightly, speech had improved since starting Requip .  He was tolerating it, was taking 2 mg twice daily.  He was physically active.  He did not have any recent constipation issues.  He was struggling a little bit with the BiPAP but overall was motivated to continue.  His wife felt that the Requip  had made a significant difference.   I reviewed his BiPAP ST compliance data from 05/16/2019 through 06/14/2019 which is a total of 30 days, during which time he used his machine 26 nights, with percent used days greater than 4 hours at 73%, indicating adequate compliance with an average usage of 4 hours and 31 minutes, residual AHI at goal at 2.8/h, leak acceptable with a 95th percentile at 12.1 L/min on a pressure of 12/7 with a rate of 14.     I saw him on 12/06/2018, at which time we talked about his recent brain SPECT scan, DaT scan, from 12/01/2018 which showed decreased radiotracer activity within the posterior left and right striata. I suggested we start him on ropinirole  with gradual increase and I made a referral to neuro rehabilitation. We also talked about utilizing coenzyme Q10 over-the-counter as a supplement. We also talked about his sleep study results.   He had had a baseline sleep study in December 2019 which showed central sleep apnea, he had a BiPAP titration study in January 2020. His second sleep study showed some changes in keeping with REM behavior disorder.   I reviewed his BiPAP compliance data from 01/03/2019 through 02/01/2019 which is a total of 30 days, during which time he used his BiPAP every night with percent used days greater than 4 hours at 87%, indicating very good compliance with an average usage of 4 hours  and 36 minutes, residual AHI at goal at 1.7 per hour, leak acceptable with the 95th percentile at 7.5 L/m on a pressure of 12/7 with a backup rate of 14.   I first met him on 09/21/2018 at the request of his primary care physician, at which time he reported changes in his gait and posture. He also reported a 3+ year history of vivid dreams and dream enactment behavior. His exam showed evidence of parkinsonism with left-sided lateralization. I suggested further workup for parkinsonism including a DaT scan, and sleep study testing.    He had a nuclear medicine DaT scan on 12/01/2018 and I reviewed the results: IMPRESSION: Decreased radiotracer activity (dopamine transporter populations) within the posterior LEFT and RIGHT striata (putamen) is a pattern suggestive of Parkinson's syndrome pathology.   We called him with his test results.    His baseline sleep study in December 2019 showed moderate primary central sleep apnea. He was advised to return for a BiPAP titration study, which he had in January 2020. During the second sleep study also had some REM related increase in muscle tone and vocalization, in keeping with his  history of REM behavior disorder.   09/21/2018: (He) has had some recent changes in his gait and posture. He has had an intermittent tremor, he does not notice his motor changes or posture changes himself but his kids have noticed it and wife has noticed it. He is more concerned about his sleep disturbance. His wife reports that for the past 3+ years he has had intermittent vivid dreams and erratic behaviors and dreams including movements, twitching, and even more dramatic movements such as grabbing her. She sometimes gets scared because of the unpredictable behaviors in his sleep. They still sleep in the same bed together. They have been married 43 years. She has noticed increase in snoring and sometimes apneic pauses while he is asleep. He sometimes reports vivid dreams to her  including fighting with an animal but typically does not have recollection of his dreams. He has a history of Parkinson's disease in paternal grandmother. His own mother lived to be 49 and had trembling or parkinsonism in her 58s. Of note, their son who is currently 15 was diagnosed with essential tremor when he was a child, maybe as young as 62 years old, no significant progression in the tremor and son has not been on any symptomatic treatment for this. Cognitively, patient is fine. He retired last year, he was a clinical research associate. He is very active physically and socially. He likes to golf about 3 days out of the week. They have 3 children, son is the oldest, they have 1 daughter in [place] and one in [place], [place] ; she has 2 kids, ages 44 and 110. I reviewed your office note from 09/09/2018, which you kindly included. His Epworth sleepiness score is 8 out of 24 today, fatigue score is 21 out of 63. He is particularly worried about his sleep disturbance. He is married and lives with his wife, he is a nonsmoker, retired, does not utilize alcohol or caffeine on a regular basis.      His Past Medical History Is Significant For: Past Medical History:  Diagnosis Date   Allergic rhinitis    Back pain    lumbar   Hearing loss    Hypercholesterolemia     His Past Surgical History Is Significant For: Past Surgical History:  Procedure Laterality Date   VASECTOMY      His Family History Is Significant For: Family History  Problem Relation Age of Onset   Parkinson's disease Mother    Dementia Paternal Grandmother     His Social History Is Significant For: Social History   Socioeconomic History   Marital status: Married    Spouse name: Not on file   Number of children: Not on file   Years of education: Not on file   Highest education level: Not on file  Occupational History   Not on file  Tobacco Use   Smoking status: Never   Smokeless tobacco: Never  Vaping Use   Vaping status: Never Used   Substance and Sexual Activity   Alcohol use: Yes    Comment: occ   Drug use: Not Currently   Sexual activity: Not on file  Other Topics Concern   Not on file  Social History Narrative   Lives with wife    Retired    Social Drivers of Health   Tobacco Use: Low Risk (11/13/2024)   Patient History    Smoking Tobacco Use: Never    Smokeless Tobacco Use: Never    Passive Exposure: Not on file  Financial Resource Strain:  Low Risk (04/28/2024)   Received from [facility]   Overall Financial Resource Strain (CARDIA)    Difficulty of Paying Living Expenses: Not hard at all  Food Insecurity: No Food Insecurity (04/28/2024)   Received from [facility]   Epic    Within the past 12 months, you worried that your food would run out before you got the money to buy more.: Never David    Within the past 12 months, the food you bought just didn't last and you didn't have money to get more.: Never David  Transportation Needs: No Transportation Needs (04/28/2024)   Received from [facility] - Transportation    Lack of Transportation (Medical): No    Lack of Transportation (Non-Medical): No  Physical Activity: Not on file  Stress: Not on file  Social Connections: Not on file  Depression (EYV7-0): Not on file  Alcohol Screen: Not on file  Housing: Low Risk (04/28/2024)   Received from [facility]    In the last 12 months, was there a time when you were not able to pay the mortgage or rent on time?: No    In the past 12 months, how many times have you moved where you were living?: 0    At any time in the past 12 months, were you homeless or living in a shelter (including now)?: No  Utilities: Not At Risk (04/28/2024)   Received from [facility] Utilities    Threatened with loss of utilities: No  Health Literacy: Not on file    His Allergies Are:  Allergies[1]:   His Current Medications Are:  Outpatient Encounter Medications as of 11/13/2024  Medication Sig    carbidopa -levodopa  (SINEMET  IR) 25-100 MG tablet Take 1.5 tablets by mouth 4 (four) times daily. Take at 6 AM, 10 AM and 2 PM and 6 PM daily   fluticasone (FLONASE) 50 MCG/ACT nasal spray Place into both nostrils daily.   Ibuprofen (ADVIL PO) Take by mouth. (Patient taking differently: Take by mouth as needed.)   Melatonin 3 MG TABS Take 6 mg by mouth at bedtime.   Multiple Vitamins-Minerals (ICAPS AREDS 2 PO) Take by mouth.   Multiple Vitamins-Minerals (MULTIVITAMIN WITH MINERALS) tablet Take 1 tablet by mouth daily.   Omega-3 Fatty Acids (FISH OIL) 1000 MG CAPS Take by mouth.   rivastigmine  (EXELON ) 1.5 MG capsule Take 1 capsule (1.5 mg total) by mouth 2 (two) times daily.   rOPINIRole  (REQUIP ) 3 MG tablet Take 1 tablet (3 mg total) by mouth in the morning, at noon, and at bedtime.   rosuvastatin (CRESTOR) 10 MG tablet Take 10 mg by mouth at bedtime.   acetaminophen  (TYLENOL ) 500 MG tablet Take 500 mg by mouth.   celecoxib (CELEBREX) 200 MG capsule Take 200 mg by mouth.   No facility-administered encounter medications on file as of 11/13/2024.  :   Review of Systems:  Out of a complete 14 point review of systems, all are reviewed and negative with the exception of these symptoms as listed below:  Review of Systems  Objective:  Neurological Exam  Physical Exam Physical Examination:   Vitals:   11/13/24 0747  BP: 93/63  Pulse: (!) 106    General Examination: The patient is a very pleasant 74 y.o. male in no acute distress. He appears well-nourished and well groomed.    HEENT: Normocephalic, atraumatic, tracking is fair, corrective eyeglasses in place.  Hearing grossly intact with hearing aids in  place.  He is status post cataract repairs.  Face is symmetric, I did not remove his facemask today.  Neck is moderately stiff but not much worse than before.  Speech is quite raspy and higher pitched but not much worse, moderate hypophonia, no significant dysarthria.   No carotid  bruits.  Chest: Clear to auscultation without wheezing, rhonchi or crackles noted.   Heart: S1+S2+0, regular and normal without murmurs, rubs or gallops noted.    Abdomen: Soft, non-tender and non-distended.   Extremities: There is no obvious swelling in the distal lower extremities bilaterally.   Skin: Warm and dry without trophic changes noted.   Musculoskeletal: exam reveals no major joint deformity.      Neurologically:  Mental status: The patient is awake, pays attention, unable to provide details of his history.        09/14/2024    8:54 AM 09/30/2023   10:27 AM 08/20/2021   10:47 AM  MMSE - Mini Mental State Exam  Orientation to time 5 4 5   Orientation to Place 1 5 5   Registration 3 3 3   Attention/ Calculation 4 5 5   Recall 3 3 3   Language- name 2 objects 2 2 2   Language- repeat 1 1 1   Language- follow 3 step command 3 3 3   Language- read & follow direction 1 1 1   Write a sentence 1 1 1   Copy design 1 1 1   Total score 25 29 30     On 09/30/2023: CDT: 4/4, AFT: 13/min.     On 08/20/2021: CDT: 4/4, AFT: 20/min.   Cranial nerves II - XII are as described above under HEENT exam.  Motor exam: Normal bulk, strength and tone with the exception of mild cogwheeling in the left upper extremity, slight cogwheeling in the right upper extremity also. He has an intermittent mild resting tremor in the left upper extremity no other resting tremor, very slight on the right side.  No obvious dyskinesias.  Moderate bradykinesia.     (On 09/21/2018:on Archimedes spiral drawing he has no significant difficulty with his right hand which is his dominant hand, he has mild insecurity but no obvious trembling with the left hand, handwriting is legible, not particularly tremulous, but on the smaller side.)   On fine motor testing he has mild to moderate difficulty, more so on the left.  Cerebellar testing: No obvious dysmetria or intention tremor.  Sensory exam: intact to light touch in  the upper and lower extremities.  Gait, station and balance: He stands slowly without help, posture is mild to moderately stooped, walks with decreased stride length and pace and decreased arm swing, no walking aid.   Assessment and Plan:    In summary, David Krueger is a very pleasant 74 year old right-handed gentleman with an underlying medical history of allergic rhinitis, hyperlipidemia, scoliosis, lumbar spondylosis, left macular degeneration, and anemia, who presents for follow-up consultation of his Parkinson's disease, complicated by scoliosis, memory loss, LBP, RBD and history of CSA (no longer on BiPAP therapy). Memory scores were good in October 2022 and slightly worse in November 2024.  Memory scores have declined.    He has been using hearing aids since August 2023.     He has a pain and was considered for radiofrequency ablation but may not be a candidate.  He had recent side effects with prednisone and is off of the medication, off of Celebrex and baclofen as well.  He does utilize lidocaine  patches at times.  He can  also use Voltaren gel if needed.  Today, they are advised to increase Sinemet  to 2 pills 4 times a day and decrease ropinirole  to 2 mg 3 times daily from currently 3 mg 3 times daily.  He was on long-acting ropinirole  for a long time, 8 mg strength until July 2023 when it became cost prohibitive.    He could not tolerate BiPAP and eventually stopped using the machine in January 2021, felt better without it.    He had a DaTscan  on 12/01/2018 which showed findings supportive of an underlying parkinsonian syndrome. His history and prior sleep study results were supportive of REM behavior disorder as well.    He was found to have central sleep apnea and has started BiPAP therapy for this in January 2020. He was compliant with his BiPAP ST, but stopped using BiPAP in January 2021.     He had hernia surgery in January 2022 with bilateral repair.   He has been on  rivastigmine  1.5 mg twice daily since May 2025 and I discussed increasing it but would like to hold off because of his hallucinations.  We may consider it at the next visit.    We discussed the possibility of utilizing Nuplazid, we will consider this at the next appointment if needed.    He is advised to avoid driving for now.    We will proceed with an EEG and call with the results.    He is advised to increase his water intake to about 48 to 64 ounces per day, he may average around 32-48 at this time.  He has decreased his sweet tea intake and has been utilizing diluted cranberry juice.  We will follow-up in about 3 months with me in 6 to 8 months with Megan.  I answered all their questions today to the patient and his wife and daughter were in agreement.  I spent 45 minutes in total face-to-face time and in reviewing records during pre-charting, more than 50% of which was spent in counseling and coordination of care, reviewing test results, reviewing medications and treatment regimen and/or in discussing or reviewing the diagnosis of Parkinson's disease, memory loss, psychosis with Parkinson's disease, the prognosis and treatment options. Pertinent laboratory and imaging test results that were available during this visit with the patient were reviewed by me and considered in my medical decision making (see chart for details).       [1] No Known Allergies

## 2024-11-14 ENCOUNTER — Ambulatory Visit: Admitting: Neurology

## 2024-11-14 ENCOUNTER — Ambulatory Visit: Payer: Self-pay | Admitting: Neurology

## 2024-11-14 DIAGNOSIS — R4182 Altered mental status, unspecified: Secondary | ICD-10-CM

## 2024-11-14 DIAGNOSIS — R413 Other amnesia: Secondary | ICD-10-CM

## 2024-11-14 DIAGNOSIS — G20A2 Parkinson's disease without dyskinesia, with fluctuations: Secondary | ICD-10-CM

## 2024-11-14 DIAGNOSIS — G20A1 Parkinson's disease without dyskinesia, without mention of fluctuations: Secondary | ICD-10-CM

## 2024-11-14 DIAGNOSIS — R410 Disorientation, unspecified: Secondary | ICD-10-CM

## 2024-11-14 NOTE — Procedures (Signed)
" ° ° °  History:  74 year old man with memory loss and episodic confusion   EEG classification: Awake and drowsy  Duration: 28 minutes   Technical aspects: This EEG study was done with scalp electrodes positioned according to the 10-20 International system of electrode placement. Electrical activity was reviewed with band pass filter of 1-70Hz , sensitivity of 7 uV/mm, display speed of 82mm/sec with a 60Hz  notched filter applied as appropriate. EEG data were recorded continuously and digitally stored.   Description of the recording: The background rhythms of this recording consists of a fairly well modulated medium amplitude theta rhythm of 6-7 Hz that is reactive to eye opening and closure. Present in the anterior head region is a 15-20 Hz beta activity. Photic stimulation was performed, did not show any abnormalities. Hyperventilation was also performed, did not show any abnormalities. Drowsiness was manifested by background fragmentation. No abnormal epileptiform discharges seen during this recording. There was mild diffuse slowing. There were no electrographic seizure identified.   Abnormality: Mild diffuse slowing   Impression: This EEG is suggestive of a generalized brain dysfunction such as encephalopathy, nonspecific etiology. There were no seizures or epileptiform discharges seen.    Pastor Falling, MD [facility]  "

## 2024-11-15 ENCOUNTER — Other Ambulatory Visit: Payer: Self-pay | Admitting: Neurology

## 2024-11-15 DIAGNOSIS — G20A1 Parkinson's disease without dyskinesia, without mention of fluctuations: Secondary | ICD-10-CM

## 2024-11-15 DIAGNOSIS — R410 Disorientation, unspecified: Secondary | ICD-10-CM

## 2024-11-15 DIAGNOSIS — G20A2 Parkinson's disease without dyskinesia, with fluctuations: Secondary | ICD-10-CM

## 2024-11-15 DIAGNOSIS — R413 Other amnesia: Secondary | ICD-10-CM

## 2024-11-17 NOTE — Telephone Encounter (Signed)
 Last seen on 11/14/24 Follow up scheduled on 02/15/25

## 2024-11-17 NOTE — Telephone Encounter (Signed)
 Rx sent on 11/13/24.

## 2024-11-23 ENCOUNTER — Ambulatory Visit: Admitting: Adult Health

## 2025-02-12 ENCOUNTER — Ambulatory Visit: Admitting: Neurology

## 2025-02-15 ENCOUNTER — Ambulatory Visit: Admitting: Neurology

## 2025-03-13 ENCOUNTER — Ambulatory Visit: Admitting: Neurology

## 2025-10-01 ENCOUNTER — Ambulatory Visit: Admitting: Adult Health
